# Patient Record
Sex: Female | Born: 1955 | Race: White | Hispanic: No | Marital: Married | Smoking: Former smoker
Health system: Southern US, Community
[De-identification: ages and names within clinical notes are randomized; demographics above are authoritative.]

## PROBLEM LIST (undated history)

## (undated) DIAGNOSIS — F909 Attention-deficit hyperactivity disorder, unspecified type: Secondary | ICD-10-CM

## (undated) DIAGNOSIS — Z974 Presence of external hearing-aid: Secondary | ICD-10-CM

## (undated) DIAGNOSIS — F32A Depression, unspecified: Secondary | ICD-10-CM

## (undated) DIAGNOSIS — R000 Tachycardia, unspecified: Secondary | ICD-10-CM

## (undated) DIAGNOSIS — M1990 Unspecified osteoarthritis, unspecified site: Secondary | ICD-10-CM

## (undated) DIAGNOSIS — R0600 Dyspnea, unspecified: Secondary | ICD-10-CM

## (undated) DIAGNOSIS — K219 Gastro-esophageal reflux disease without esophagitis: Secondary | ICD-10-CM

## (undated) DIAGNOSIS — I499 Cardiac arrhythmia, unspecified: Secondary | ICD-10-CM

## (undated) DIAGNOSIS — Z973 Presence of spectacles and contact lenses: Secondary | ICD-10-CM

## (undated) DIAGNOSIS — F329 Major depressive disorder, single episode, unspecified: Secondary | ICD-10-CM

## (undated) HISTORY — DX: Gastro-esophageal reflux disease without esophagitis: K21.9

## (undated) HISTORY — DX: Tachycardia, unspecified: R00.0

## (undated) HISTORY — DX: Cardiac arrhythmia, unspecified: I49.9

## (undated) HISTORY — DX: Dyspnea, unspecified: R06.00

---
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
## 1981-03-31 HISTORY — PX: ABDOMINAL HYSTERECTOMY: SHX81

## 1983-04-01 HISTORY — PX: KNEE SURGERY: SHX244

## 1996-03-31 HISTORY — PX: OTHER SURGICAL HISTORY: SHX169

## 1998-10-02 ENCOUNTER — Emergency Department (HOSPITAL_COMMUNITY): Admission: EM | Admit: 1998-10-02 | Discharge: 1998-10-02 | Payer: Self-pay | Admitting: Emergency Medicine

## 1998-11-13 ENCOUNTER — Other Ambulatory Visit: Admission: RE | Admit: 1998-11-13 | Discharge: 1998-11-13 | Payer: Self-pay | Admitting: Obstetrics and Gynecology

## 1999-11-14 ENCOUNTER — Encounter: Admission: RE | Admit: 1999-11-14 | Discharge: 1999-11-14 | Payer: Self-pay | Admitting: Gastroenterology

## 1999-11-14 ENCOUNTER — Encounter: Payer: Self-pay | Admitting: Gastroenterology

## 1999-12-12 ENCOUNTER — Other Ambulatory Visit: Admission: RE | Admit: 1999-12-12 | Discharge: 1999-12-12 | Payer: Self-pay | Admitting: Obstetrics and Gynecology

## 2000-03-31 HISTORY — PX: ANTERIOR AND POSTERIOR VAGINAL REPAIR: SUR5

## 2000-08-18 ENCOUNTER — Observation Stay (HOSPITAL_COMMUNITY): Admission: RE | Admit: 2000-08-18 | Discharge: 2000-08-19 | Payer: Self-pay | Admitting: Obstetrics and Gynecology

## 2001-01-06 ENCOUNTER — Other Ambulatory Visit: Admission: RE | Admit: 2001-01-06 | Discharge: 2001-01-06 | Payer: Self-pay | Admitting: Obstetrics and Gynecology

## 2002-03-14 ENCOUNTER — Other Ambulatory Visit: Admission: RE | Admit: 2002-03-14 | Discharge: 2002-03-14 | Payer: Self-pay | Admitting: Obstetrics and Gynecology

## 2003-04-06 ENCOUNTER — Other Ambulatory Visit: Admission: RE | Admit: 2003-04-06 | Discharge: 2003-04-06 | Payer: Self-pay | Admitting: Obstetrics and Gynecology

## 2003-12-11 ENCOUNTER — Encounter: Admission: RE | Admit: 2003-12-11 | Discharge: 2003-12-11 | Payer: Self-pay | Admitting: Internal Medicine

## 2004-05-29 ENCOUNTER — Other Ambulatory Visit: Admission: RE | Admit: 2004-05-29 | Discharge: 2004-05-29 | Payer: Self-pay | Admitting: Obstetrics and Gynecology

## 2005-06-27 ENCOUNTER — Encounter: Admission: RE | Admit: 2005-06-27 | Discharge: 2005-06-27 | Payer: Self-pay | Admitting: Internal Medicine

## 2005-09-13 ENCOUNTER — Encounter: Admission: RE | Admit: 2005-09-13 | Discharge: 2005-09-13 | Payer: Self-pay | Admitting: Neurology

## 2012-06-17 ENCOUNTER — Other Ambulatory Visit: Payer: Self-pay | Admitting: Internal Medicine

## 2012-06-17 DIAGNOSIS — R109 Unspecified abdominal pain: Secondary | ICD-10-CM

## 2012-06-18 ENCOUNTER — Ambulatory Visit
Admission: RE | Admit: 2012-06-18 | Discharge: 2012-06-18 | Disposition: A | Discharge status: Home / Self Care | Payer: BC Managed Care – PPO | Source: Ambulatory Visit | Attending: Internal Medicine | Admitting: Internal Medicine

## 2012-06-18 DIAGNOSIS — R109 Unspecified abdominal pain: Secondary | ICD-10-CM

## 2012-06-22 ENCOUNTER — Encounter (INDEPENDENT_AMBULATORY_CARE_PROVIDER_SITE_OTHER): Payer: Self-pay | Admitting: Surgery

## 2012-06-28 ENCOUNTER — Encounter (INDEPENDENT_AMBULATORY_CARE_PROVIDER_SITE_OTHER): Payer: Self-pay | Admitting: Surgery

## 2012-06-28 ENCOUNTER — Ambulatory Visit (INDEPENDENT_AMBULATORY_CARE_PROVIDER_SITE_OTHER): Payer: BC Managed Care – PPO | Admitting: Surgery

## 2012-06-28 DIAGNOSIS — K8020 Calculus of gallbladder without cholecystitis without obstruction: Secondary | ICD-10-CM | POA: Insufficient documentation

## 2012-06-28 NOTE — Progress Notes (Signed)
Patient ID: OTHA Powers, female   DOB: 07-17-1955, 57 y.o.   MRN: [id number]  Chief Complaint  Patient presents with  . Other    Eval gallbladder    HPI Kayla Powers is a 57 y.o. female.   HPI She is referred by Dr. Benjaman Kindler for evaluation of epigastric abdominal pain. She has had 2 separate attacks of epigastric abdominal pain which created shortness of breath. Her last was a week ago. She had no nausea or vomiting with this attack. She did have some pain referred over to her flank. It is difficult to remember what she ate prior to the attacks. She has chronic constipation. The pain was severe and sharp when it occurred. She is otherwise without complaints. Past Medical History  Diagnosis Date  . Tachycardia   . Arrhythmia   . Dyspnea   . GERD (gastroesophageal reflux disease)     Past Surgical History  Procedure Laterality Date  . Abdominal hysterectomy  1983  . Appendectomy  1971  . Breast implants  1998  . Knee surgery  1985  . Anterior and posterior vaginal repair  2002    History reviewed. No pertinent family history.  Social History History  Substance Use Topics  . Smoking status: Former Smoker    Quit date: 04/02/2004  . Smokeless tobacco: Not on file  . Alcohol Use: Yes    Allergies  Allergen Reactions  . Codeine   . Penicillins     Current Outpatient Prescriptions  Medication Sig Dispense Refill  . amitriptyline (ELAVIL) 75 MG tablet Take by mouth at bedtime.      Marland Kitchen amphetamine-dextroamphetamine (ADDERALL) 5 MG tablet Take 5 mg by mouth daily.      Marland Kitchen atenolol (TENORMIN) 100 MG tablet Take 100 mg by mouth daily.      Marland Kitchen buPROPion (WELLBUTRIN) 75 MG tablet Take 75 mg by mouth 2 (two) times daily.      . calcium citrate (CALCITRATE - DOSED IN MG ELEMENTAL CALCIUM) 950 MG tablet Take 1 tablet by mouth daily.      Marland Kitchen estradiol (VIVELLE-DOT) 0.025 MG/24HR Place 1 patch onto the skin 2 (two) times a week.      . Omeprazole (PRILOSEC PO) Take by  mouth.      . beta carotene w/minerals (OCUVITE) tablet Take 1 tablet by mouth daily.       No current facility-administered medications for this visit.    Review of Systems Review of Systems  Constitutional: Negative for fever, chills and unexpected weight change.  HENT: Negative for hearing loss, congestion, sore throat, trouble swallowing and voice change.   Eyes: Negative for visual disturbance.  Respiratory: Negative for cough and wheezing.   Cardiovascular: Negative for chest pain, palpitations and leg swelling.  Gastrointestinal: Positive for abdominal pain. Negative for nausea, vomiting, diarrhea, constipation, blood in stool, abdominal distention and anal bleeding.  Genitourinary: Negative for hematuria, vaginal bleeding and difficulty urinating.  Musculoskeletal: Negative for arthralgias.  Skin: Negative for rash and wound.  Neurological: Negative for seizures, syncope and headaches.  Hematological: Negative for adenopathy. Does not bruise/bleed easily.  Psychiatric/Behavioral: Negative for confusion.    There were no vitals taken for this visit.  Physical Exam Physical Exam  Constitutional: She is oriented to person, place, and time. She appears well-developed and well-nourished. No distress.  HENT:  Head: Normocephalic and atraumatic.  Right Ear: External ear normal.  Left Ear: External ear normal.  Nose: Nose normal.  Mouth/Throat: Oropharynx is clear  and moist. No oropharyngeal exudate.  Eyes: Conjunctivae are normal. Pupils are equal, round, and reactive to light. Right eye exhibits no discharge. Left eye exhibits no discharge. No scleral icterus.  Neck: Normal range of motion. Neck supple. No tracheal deviation present. No thyromegaly present.  Cardiovascular: Normal rate, regular rhythm and intact distal pulses.   Murmur heard. Pulmonary/Chest: Effort normal and breath sounds normal. No respiratory distress. She has no wheezes.  Abdominal: Soft. Bowel sounds are  normal. She exhibits no distension. There is no tenderness. There is no rebound.  Musculoskeletal: Normal range of motion. She exhibits no edema and no tenderness.  Lymphadenopathy:    She has no cervical adenopathy.  Neurological: She is alert and oriented to person, place, and time.  Skin: Skin is warm and dry. No rash noted. She is not diaphoretic. No erythema.  Psychiatric: Her behavior is normal. Judgment normal.    Data Reviewed I reviewed her ultrasound the abdomen showing either one large gallstone or a conglomeration of small gallstones. The common bile duct is normal and there is no gallbladder wall thickening  Assessment    Symptomatic cholelithiasis     Plan    Laparoscopic cholecystectomy with possible cholangiogram is recommended. I gave her literature regarding this and discussed in detail. I discussed the risks which includes but is not limited to bleeding, infection, bile duct injury, bile leak, need to convert to an open procedure, postoperative recovery, et Karie Soda. She understands and wishes to proceed. Likelihood of success is good        Dondrell Loudermilk A 06/28/2012, 10:26 AM

## 2012-07-02 ENCOUNTER — Encounter (HOSPITAL_BASED_OUTPATIENT_CLINIC_OR_DEPARTMENT_OTHER): Payer: Self-pay | Admitting: *Deleted

## 2012-07-02 NOTE — Progress Notes (Signed)
Pt has hx tachy hr-saw dr Rico Ala yearly controlled with meds Will get notes-to come in for [id number]

## 2012-07-05 ENCOUNTER — Encounter (HOSPITAL_BASED_OUTPATIENT_CLINIC_OR_DEPARTMENT_OTHER)
Admission: RE | Admit: 2012-07-05 | Discharge: 2012-07-05 | Disposition: A | Discharge status: Home / Self Care | Payer: BC Managed Care – PPO | Source: Ambulatory Visit | Attending: Surgery | Admitting: Surgery

## 2012-07-05 LAB — BASIC METABOLIC PANEL
BUN: 15 mg/dL (ref 6–23)
CO2: 30 mEq/L (ref 19–32)
GFR calc non Af Amer: >90 mL/min (ref >90)
Glucose, Bld: 69 mg/dL — ABNORMAL LOW (ref 70–99)
Potassium: 4.4 mEq/L (ref 3.5–5.1)
Sodium: 140 mEq/L (ref 135–145)

## 2012-07-07 ENCOUNTER — Telehealth: Payer: Self-pay | Admitting: *Deleted

## 2012-07-07 MED ORDER — BUPROPION HCL 75 MG PO TABS: 75.0000 mg | ORAL_TABLET | Freq: Two times a day (BID) | ORAL | Status: DC

## 2012-07-07 NOTE — Telephone Encounter (Signed)
Please inform this pt she needs to see Dr Alberteen Sam to get refill on her Wellbutrin. We only can refill 30 days. Thanks PG

## 2012-07-07 NOTE — H&P (Signed)
Patient ID: Kayla Powers, female DOB: 1956-01-05, 57 y.o. MRN: [id number]  Chief Complaint   Patient presents with   .  Other     Eval gallbladder   HPI  Kayla Powers is a 57 y.o. female.  HPI  She is referred by Dr. Benjaman Kindler for evaluation of epigastric abdominal pain. She has had 2 separate attacks of epigastric abdominal pain which created shortness of breath. Her last was a week ago. She had no nausea or vomiting with this attack. She did have some pain referred over to her flank. It is difficult to remember what she ate prior to the attacks. She has chronic constipation. The pain was severe and sharp when it occurred. She is otherwise without complaints.  Past Medical History   Diagnosis  Date   .  Tachycardia    .  Arrhythmia    .  Dyspnea    .  GERD (gastroesophageal reflux disease)     Past Surgical History   Procedure  Laterality  Date   .  Abdominal hysterectomy   1983   .  Appendectomy   1971   .  Breast implants   1998   .  Knee surgery   1985   .  Anterior and posterior vaginal repair   2002   History reviewed. No pertinent family history.  Social History  History   Substance Use Topics   .  Smoking status:  Former Smoker     Quit date:  04/02/2004   .  Smokeless tobacco:  Not on file   .  Alcohol Use:  Yes    Allergies   Allergen  Reactions   .  Codeine    .  Penicillins     Current Outpatient Prescriptions   Medication  Sig  Dispense  Refill   .  amitriptyline (ELAVIL) 75 MG tablet  Take by mouth at bedtime.     Marland Kitchen  amphetamine-dextroamphetamine (ADDERALL) 5 MG tablet  Take 5 mg by mouth daily.     Marland Kitchen  atenolol (TENORMIN) 100 MG tablet  Take 100 mg by mouth daily.     Marland Kitchen  buPROPion (WELLBUTRIN) 75 MG tablet  Take 75 mg by mouth 2 (two) times daily.     .  calcium citrate (CALCITRATE - DOSED IN MG ELEMENTAL CALCIUM) 950 MG tablet  Take 1 tablet by mouth daily.     Marland Kitchen  estradiol (VIVELLE-DOT) 0.025 MG/24HR  Place 1 patch onto the skin 2 (two) times a  week.     .  Omeprazole (PRILOSEC PO)  Take by mouth.     .  beta carotene w/minerals (OCUVITE) tablet  Take 1 tablet by mouth daily.      No current facility-administered medications for this visit.   Review of Systems  Review of Systems  Constitutional: Negative for fever, chills and unexpected weight change.  HENT: Negative for hearing loss, congestion, sore throat, trouble swallowing and voice change.  Eyes: Negative for visual disturbance.  Respiratory: Negative for cough and wheezing.  Cardiovascular: Negative for chest pain, palpitations and leg swelling.  Gastrointestinal: Positive for abdominal pain. Negative for nausea, vomiting, diarrhea, constipation, blood in stool, abdominal distention and anal bleeding.  Genitourinary: Negative for hematuria, vaginal bleeding and difficulty urinating.  Musculoskeletal: Negative for arthralgias.  Skin: Negative for rash and wound.  Neurological: Negative for seizures, syncope and headaches.  Hematological: Negative for adenopathy. Does not bruise/bleed easily.  Psychiatric/Behavioral: Negative for confusion.  There were  no vitals taken for this visit.  Physical Exam  Physical Exam  Constitutional: She is oriented to person, place, and time. She appears well-developed and well-nourished. No distress.  HENT:  Head: Normocephalic and atraumatic.  Right Ear: External ear normal.  Left Ear: External ear normal.  Nose: Nose normal.  Mouth/Throat: Oropharynx is clear and moist. No oropharyngeal exudate.  Eyes: Conjunctivae are normal. Pupils are equal, round, and reactive to light. Right eye exhibits no discharge. Left eye exhibits no discharge. No scleral icterus.  Neck: Normal range of motion. Neck supple. No tracheal deviation present. No thyromegaly present.  Cardiovascular: Normal rate, regular rhythm and intact distal pulses.  Murmur heard.  Pulmonary/Chest: Effort normal and breath sounds normal. No respiratory distress. She has no  wheezes.  Abdominal: Soft. Bowel sounds are normal. She exhibits no distension. There is no tenderness. There is no rebound.  Musculoskeletal: Normal range of motion. She exhibits no edema and no tenderness.  Lymphadenopathy:  She has no cervical adenopathy.  Neurological: She is alert and oriented to person, place, and time.  Skin: Skin is warm and dry. No rash noted. She is not diaphoretic. No erythema.  Psychiatric: Her behavior is normal. Judgment normal.  Data Reviewed  I reviewed her ultrasound the abdomen showing either one large gallstone or a conglomeration of small gallstones. The common bile duct is normal and there is no gallbladder wall thickening  Assessment  Symptomatic cholelithiasis  Plan  Laparoscopic cholecystectomy with possible cholangiogram is recommended. I gave her literature regarding this and discussed in detail. I discussed the risks which includes but is not limited to bleeding, infection, bile duct injury, bile leak, need to convert to an open procedure, postoperative recovery, et Karie Soda. She understands and wishes to proceed. Likelihood of success is good

## 2012-07-08 ENCOUNTER — Ambulatory Visit (HOSPITAL_BASED_OUTPATIENT_CLINIC_OR_DEPARTMENT_OTHER)
Admission: RE | Admit: 2012-07-08 | Discharge: 2012-07-08 | Disposition: A | Discharge status: Home / Self Care | Payer: BC Managed Care – PPO | Source: Ambulatory Visit | Attending: Surgery | Admitting: Surgery

## 2012-07-08 ENCOUNTER — Encounter (HOSPITAL_BASED_OUTPATIENT_CLINIC_OR_DEPARTMENT_OTHER): Payer: Self-pay | Admitting: *Deleted

## 2012-07-08 ENCOUNTER — Encounter (HOSPITAL_BASED_OUTPATIENT_CLINIC_OR_DEPARTMENT_OTHER)
Admission: RE | Disposition: A | Discharge status: Home / Self Care | Payer: Self-pay | Source: Ambulatory Visit | Attending: Surgery

## 2012-07-08 ENCOUNTER — Ambulatory Visit (HOSPITAL_BASED_OUTPATIENT_CLINIC_OR_DEPARTMENT_OTHER): Payer: BC Managed Care – PPO | Admitting: *Deleted

## 2012-07-08 DIAGNOSIS — K8010 Calculus of gallbladder with chronic cholecystitis without obstruction: Secondary | ICD-10-CM

## 2012-07-08 DIAGNOSIS — Z87891 Personal history of nicotine dependence: Secondary | ICD-10-CM | POA: Insufficient documentation

## 2012-07-08 DIAGNOSIS — Z885 Allergy status to narcotic agent status: Secondary | ICD-10-CM | POA: Insufficient documentation

## 2012-07-08 DIAGNOSIS — R000 Tachycardia, unspecified: Secondary | ICD-10-CM | POA: Insufficient documentation

## 2012-07-08 DIAGNOSIS — Z79899 Other long term (current) drug therapy: Secondary | ICD-10-CM | POA: Insufficient documentation

## 2012-07-08 DIAGNOSIS — Z880 Allergy status to penicillin: Secondary | ICD-10-CM | POA: Insufficient documentation

## 2012-07-08 DIAGNOSIS — K219 Gastro-esophageal reflux disease without esophagitis: Secondary | ICD-10-CM | POA: Insufficient documentation

## 2012-07-08 HISTORY — DX: Unspecified osteoarthritis, unspecified site: M19.90

## 2012-07-08 HISTORY — DX: Presence of spectacles and contact lenses: Z97.3

## 2012-07-08 HISTORY — DX: Major depressive disorder, single episode, unspecified: F32.9

## 2012-07-08 HISTORY — DX: Depression, unspecified: F32.A

## 2012-07-08 HISTORY — PX: CHOLECYSTECTOMY: SHX55

## 2012-07-08 HISTORY — DX: Presence of external hearing-aid: Z97.4

## 2012-07-08 HISTORY — DX: Attention-deficit hyperactivity disorder, unspecified type: F90.9

## 2012-07-08 SURGERY — LAPAROSCOPIC CHOLECYSTECTOMY
Anesthesia: General | Site: Abdomen | Wound class: Clean Contaminated

## 2012-07-08 MED ORDER — SODIUM CHLORIDE 0.9 % IV SOLN: 250.0000 mL | INTRAVENOUS | Status: DC | PRN

## 2012-07-08 MED ORDER — HYDROMORPHONE HCL PF 1 MG/ML IJ SOLN
0.2500 mg | INTRAMUSCULAR | Status: DC | PRN
  Administered 2012-07-08: 0.5 mg via INTRAVENOUS

## 2012-07-08 MED ORDER — MORPHINE SULFATE 4 MG/ML IJ SOLN: 4.0000 mg | INTRAMUSCULAR | Status: DC | PRN

## 2012-07-08 MED ORDER — HYDROMORPHONE HCL PF 1 MG/ML IJ SOLN
0.2500 mg | INTRAMUSCULAR | Status: DC | PRN
  Administered 2012-07-08 (×4): 0.5 mg via INTRAVENOUS

## 2012-07-08 MED ORDER — MIDAZOLAM HCL 2 MG/2ML IJ SOLN: 1.0000 mg | INTRAMUSCULAR | Status: DC | PRN

## 2012-07-08 MED ORDER — SODIUM CHLORIDE 0.9 % IJ SOLN: 3.0000 mL | INTRAMUSCULAR | Status: DC | PRN

## 2012-07-08 MED ORDER — DEXAMETHASONE SODIUM PHOSPHATE 4 MG/ML IJ SOLN
INTRAMUSCULAR | Status: DC | PRN
  Administered 2012-07-08: 4 mg via INTRAVENOUS

## 2012-07-08 MED ORDER — FENTANYL CITRATE 0.05 MG/ML IJ SOLN: 50.0000 ug | INTRAMUSCULAR | Status: DC | PRN

## 2012-07-08 MED ORDER — HYDROCODONE-ACETAMINOPHEN 5-325 MG PO TABS: 1.0000 | ORAL_TABLET | ORAL | Status: DC | PRN

## 2012-07-08 MED ORDER — ACETAMINOPHEN 325 MG PO TABS: 650.0000 mg | ORAL_TABLET | ORAL | Status: DC | PRN

## 2012-07-08 MED ORDER — ROCURONIUM BROMIDE 100 MG/10ML IV SOLN
INTRAVENOUS | Status: DC | PRN
  Administered 2012-07-08: 30 mg via INTRAVENOUS

## 2012-07-08 MED ORDER — BUPIVACAINE-EPINEPHRINE PF 0.5-1:200000 % IJ SOLN
INTRAMUSCULAR | Status: DC | PRN
  Administered 2012-07-08: 20 mL

## 2012-07-08 MED ORDER — ONDANSETRON HCL 4 MG/2ML IJ SOLN: 4.0000 mg | Freq: Once | INTRAMUSCULAR | Status: DC | PRN

## 2012-07-08 MED ORDER — GLYCOPYRROLATE 0.2 MG/ML IJ SOLN
INTRAMUSCULAR | Status: DC | PRN
  Administered 2012-07-08: .5 mg via INTRAVENOUS

## 2012-07-08 MED ORDER — NEOSTIGMINE METHYLSULFATE 1 MG/ML IJ SOLN
INTRAMUSCULAR | Status: DC | PRN
  Administered 2012-07-08: 2.5 mg via INTRAVENOUS

## 2012-07-08 MED ORDER — ACETAMINOPHEN 10 MG/ML IV SOLN: 1000.0000 mg | Freq: Once | INTRAVENOUS | Status: DC | PRN

## 2012-07-08 MED ORDER — MIDAZOLAM HCL 5 MG/5ML IJ SOLN
INTRAMUSCULAR | Status: DC | PRN
  Administered 2012-07-08: 1 mg via INTRAVENOUS

## 2012-07-08 MED ORDER — LIDOCAINE HCL 4 % MT SOLN
OROMUCOSAL | Status: DC | PRN
  Administered 2012-07-08: 4 mL via TOPICAL

## 2012-07-08 MED ORDER — DIAZEPAM 5 MG/ML IJ SOLN
2.5000 mg | INTRAMUSCULAR | Status: DC | PRN
  Administered 2012-07-08: 2.5 mg via INTRAVENOUS

## 2012-07-08 MED ORDER — ACETAMINOPHEN 650 MG RE SUPP: 650.0000 mg | RECTAL | Status: DC | PRN

## 2012-07-08 MED ORDER — PROPOFOL 10 MG/ML IV BOLUS
INTRAVENOUS | Status: DC | PRN
  Administered 2012-07-08: 150 mg via INTRAVENOUS
  Administered 2012-07-08: 50 mg via INTRAVENOUS

## 2012-07-08 MED ORDER — KETOROLAC TROMETHAMINE 30 MG/ML IJ SOLN
30.0000 mg | Freq: Once | INTRAMUSCULAR | Status: AC
  Administered 2012-07-08: 30 mg via INTRAVENOUS

## 2012-07-08 MED ORDER — OXYCODONE HCL 5 MG PO TABS: 5.0000 mg | ORAL_TABLET | ORAL | Status: DC | PRN

## 2012-07-08 MED ORDER — CIPROFLOXACIN IN D5W 400 MG/200ML IV SOLN
400.0000 mg | INTRAVENOUS | Status: AC
  Administered 2012-07-08 (×2): 400 mg via INTRAVENOUS

## 2012-07-08 MED ORDER — LACTATED RINGERS IV SOLN
INTRAVENOUS | Status: DC
  Administered 2012-07-08 (×2): via INTRAVENOUS

## 2012-07-08 MED ORDER — ONDANSETRON HCL 4 MG/2ML IJ SOLN
INTRAMUSCULAR | Status: DC | PRN
  Administered 2012-07-08: 4 mg via INTRAVENOUS

## 2012-07-08 MED ORDER — ONDANSETRON HCL 4 MG/2ML IJ SOLN: 4.0000 mg | Freq: Four times a day (QID) | INTRAMUSCULAR | Status: DC | PRN

## 2012-07-08 MED ORDER — SODIUM CHLORIDE 0.9 % IJ SOLN: 3.0000 mL | Freq: Two times a day (BID) | INTRAMUSCULAR | Status: DC

## 2012-07-08 MED ORDER — LIDOCAINE HCL (CARDIAC) 20 MG/ML IV SOLN
INTRAVENOUS | Status: DC | PRN
  Administered 2012-07-08: 40 mg via INTRAVENOUS

## 2012-07-08 MED ORDER — FENTANYL CITRATE 0.05 MG/ML IJ SOLN
INTRAMUSCULAR | Status: DC | PRN
  Administered 2012-07-08 (×3): 50 ug via INTRAVENOUS

## 2012-07-08 SURGICAL SUPPLY — 43 items
APL SKNCLS STERI-STRIP NONHPOA (GAUZE/BANDAGES/DRESSINGS) ×1
APPLIER CLIP 5 13 M/L LIGAMAX5 (MISCELLANEOUS) ×2
APR CLP MED LRG 5 ANG JAW (MISCELLANEOUS) ×1
BAG SPEC RTRVL LRG 6X4 10 (ENDOMECHANICALS) ×1
BANDAGE ADHESIVE 1X3 (GAUZE/BANDAGES/DRESSINGS) ×8 IMPLANT
BENZOIN TINCTURE PRP APPL 2/3 (GAUZE/BANDAGES/DRESSINGS) ×2 IMPLANT
BLADE SURG ROTATE 9660 (MISCELLANEOUS) IMPLANT
CANISTER SUCTION 2500CC (MISCELLANEOUS) ×2 IMPLANT
CHLORAPREP W/TINT 26ML (MISCELLANEOUS) ×2 IMPLANT
CLIP APPLIE 5 13 M/L LIGAMAX5 (MISCELLANEOUS) ×1 IMPLANT
CLOTH BEACON ORANGE TIMEOUT ST (SAFETY) ×2 IMPLANT
COVER MAYO STAND STRL (DRAPES) IMPLANT
DECANTER SPIKE VIAL GLASS SM (MISCELLANEOUS) ×2 IMPLANT
DRAPE C-ARM 42X72 X-RAY (DRAPES) IMPLANT
DRAPE UTILITY XL STRL (DRAPES) ×2 IMPLANT
ELECT REM PT RETURN 9FT ADLT (ELECTROSURGICAL) ×2
ELECTRODE REM PT RTRN 9FT ADLT (ELECTROSURGICAL) ×1 IMPLANT
FILTER SMOKE EVAC LAPAROSHD (FILTER) ×1 IMPLANT
GLOVE BIOGEL PI IND STRL 6.5 (GLOVE) IMPLANT
GLOVE BIOGEL PI IND STRL 7.0 (GLOVE) IMPLANT
GLOVE BIOGEL PI INDICATOR 6.5 (GLOVE) ×1
GLOVE BIOGEL PI INDICATOR 7.0 (GLOVE) ×1
GLOVE ECLIPSE 6.5 STRL STRAW (GLOVE) ×1 IMPLANT
GLOVE EXAM NITRILE PF MED BLUE (GLOVE) ×1 IMPLANT
GLOVE SURG SIGNA 7.5 PF LTX (GLOVE) ×2 IMPLANT
GOWN PREVENTION PLUS XLARGE (GOWN DISPOSABLE) ×2 IMPLANT
GOWN STRL NON-REIN LRG LVL3 (GOWN DISPOSABLE) ×6 IMPLANT
NS IRRIG 1000ML POUR BTL (IV SOLUTION) ×2 IMPLANT
PACK BASIN DAY SURGERY FS (CUSTOM PROCEDURE TRAY) ×2 IMPLANT
POUCH SPECIMEN RETRIEVAL 10MM (ENDOMECHANICALS) ×1 IMPLANT
SCISSORS LAP 5X35 DISP (ENDOMECHANICALS) IMPLANT
SET CHOLANGIOGRAPH 5 50 .035 (SET/KITS/TRAYS/PACK) IMPLANT
SET IRRIG TUBING LAPAROSCOPIC (IRRIGATION / IRRIGATOR) ×2 IMPLANT
SLEEVE ENDOPATH XCEL 5M (ENDOMECHANICALS) ×4 IMPLANT
SLEEVE SCD COMPRESS KNEE MED (MISCELLANEOUS) ×2 IMPLANT
SPECIMEN JAR SMALL (MISCELLANEOUS) ×2 IMPLANT
SUT MON AB 4-0 PC3 18 (SUTURE) ×2 IMPLANT
TOWEL OR 17X24 6PK STRL BLUE (TOWEL DISPOSABLE) ×2 IMPLANT
TOWEL OR NON WOVEN STRL DISP B (DISPOSABLE) ×2 IMPLANT
TRAY LAPAROSCOPIC (CUSTOM PROCEDURE TRAY) ×2 IMPLANT
TROCAR XCEL BLUNT TIP 100MML (ENDOMECHANICALS) ×2 IMPLANT
TROCAR XCEL NON-BLD 5MMX100MML (ENDOMECHANICALS) ×2 IMPLANT
TUBE CONNECTING 20X1/4 (TUBING) ×2 IMPLANT

## 2012-07-08 NOTE — Anesthesia Procedure Notes (Signed)
Procedure Name: Intubation Date/Time: 07/08/2012 10:05 AM Performed by: Meyer Russel Pre-anesthesia Checklist: Patient identified, Emergency Drugs available, Suction available and Patient being monitored Patient Re-evaluated:Patient Re-evaluated prior to inductionOxygen Delivery Method: Circle System Utilized Preoxygenation: Pre-oxygenation with 100% oxygen Intubation Type: IV induction Ventilation: Mask ventilation without difficulty Laryngoscope Size: Miller and 2 Grade View: Grade II Tube type: Oral Tube size: 7.0 mm Number of attempts: 2 (laryngoscopy x 1, unable to visulaizw cords.  laryngoscopy by Dr. Noreene Larsson with glide scope  ETT x i attempt.  +EtCO2 BSBE) Airway Equipment and Method: stylet,  oral airway,  LTA kit utilized,  Video-laryngoscopy and Rigid stylet Placement Confirmation: ETT inserted through vocal cords under direct vision,  positive ETCO2 and breath sounds checked- equal and bilateral Secured at: 22 cm Tube secured with: Tape Dental Injury: Teeth and Oropharynx as per pre-operative assessment

## 2012-07-08 NOTE — Transfer of Care (Signed)
Immediate Anesthesia Transfer of Care Note  Patient: Kayla Powers  Procedure(s) Performed: Procedure(s): LAPAROSCOPIC CHOLECYSTECTOMY possible IOC  (N/A)  Patient Location: PACU  Anesthesia Type:General  Level of Consciousness: awake, alert  and oriented  Airway & Oxygen Therapy: Patient Spontanous Breathing and Patient connected to face mask oxygen  Post-op Assessment: Report given to PACU RN, Post -op Vital signs reviewed and stable and Patient moving all extremities  Post vital signs: Reviewed and stable  Complications: No apparent anesthesia complications

## 2012-07-08 NOTE — Anesthesia Postprocedure Evaluation (Signed)
  Anesthesia Post-op Note  Patient: Kayla Powers  Procedure(s) Performed: Procedure(s): LAPAROSCOPIC CHOLECYSTECTOMY possible IOC  (N/A)  Patient Location: PACU  Anesthesia Type:General  Level of Consciousness: awake and alert   Airway and Oxygen Therapy: Patient Spontanous Breathing  Post-op Pain: mild  Post-op Assessment: Post-op Vital signs reviewed, Patient's Cardiovascular Status Stable, Respiratory Function Stable, Patent Airway and Pain level controlled  Post-op Vital Signs: stable  Complications: No apparent anesthesia complications

## 2012-07-08 NOTE — Anesthesia Postprocedure Evaluation (Signed)
  Anesthesia Post-op Note  Patient: Kayla Powers  Procedure(s) Performed: Procedure(s): LAPAROSCOPIC CHOLECYSTECTOMY possible IOC  (N/A)  Patient Location: PACU  Anesthesia Type:General  Level of Consciousness: awake, alert  and oriented  Airway and Oxygen Therapy: Patient Spontanous Breathing and Patient connected to nasal cannula oxygen  Post-op Pain: mild  Post-op Assessment: Post-op Vital signs reviewed, Patient's Cardiovascular Status Stable, Respiratory Function Stable, Patent Airway and Pain level controlled  Post-op Vital Signs: stable  Complications: No apparent anesthesia complications 

## 2012-07-08 NOTE — Anesthesia Preprocedure Evaluation (Signed)
Anesthesia Evaluation  Patient identified by MRN, date of birth, ID band Patient awake    Reviewed: Allergy & Precautions, H&P , NPO status , Patient's Chart, lab work & pertinent test results  Airway Mallampati: II      Dental  (+) Teeth Intact and Dental Advisory Given   Pulmonary  breath sounds clear to auscultation        Cardiovascular Rhythm:Regular Rate:Normal     Neuro/Psych    GI/Hepatic   Endo/Other    Renal/GU      Musculoskeletal   Abdominal   Peds  Hematology   Anesthesia Other Findings   Reproductive/Obstetrics                           Anesthesia Physical Anesthesia Plan  ASA: II  Anesthesia Plan: General   Post-op Pain Management:    Induction: Intravenous  Airway Management Planned: Oral ETT  Additional Equipment:   Intra-op Plan:   Post-operative Plan: Extubation in OR  Informed Consent: I have reviewed the patients History and Physical, chart, labs and discussed the procedure including the risks, benefits and alternatives for the proposed anesthesia with the patient or authorized representative who has indicated his/her understanding and acceptance.   Dental advisory given  Plan Discussed with: CRNA, Surgeon and Anesthesiologist  Anesthesia Plan Comments:         Anesthesia Quick Evaluation

## 2012-07-08 NOTE — Op Note (Signed)
Laparoscopic Cholecystectomy Procedure Note  Indications: This patient presents with symptomatic gallbladder disease and will undergo laparoscopic cholecystectomy.  Pre-operative Diagnosis: Calculus of gallbladder without mention of cholecystitis or obstruction  Post-operative Diagnosis: Same  Surgeon: Abigail Miyamoto A   Assistants: 0  Anesthesia: General endotracheal anesthesia  ASA Class: 1  Procedure Details  The patient was seen again in the Holding Room. The risks, benefits, complications, treatment options, and expected outcomes were discussed with the patient. The possibilities of reaction to medication, pulmonary aspiration, perforation of viscus, bleeding, recurrent infection, finding a normal gallbladder, the need for additional procedures, failure to diagnose a condition, the possible need to convert to an open procedure, and creating a complication requiring transfusion or operation were discussed with the patient. The likelihood of improving the patient's symptoms with return to their baseline status is good.  The patient and/or family concurred with the proposed plan, giving informed consent. The site of surgery properly noted. The patient was taken to Operating Room, identified as Laurell Josephs and the procedure verified as Laparoscopic Cholecystectomy with Intraoperative Cholangiogram. A Time Out was held and the above information confirmed.  Prior to the induction of general anesthesia, antibiotic prophylaxis was administered. General endotracheal anesthesia was then administered and tolerated well. After the induction, the abdomen was prepped with Chloraprep and draped in sterile fashion. The patient was positioned in the supine position.  Local anesthetic agent was injected into the skin near the umbilicus and an incision made. We dissected down to the abdominal fascia with blunt dissection.  The fascia was incised vertically and we entered the peritoneal cavity  bluntly.  A pursestring suture of 0-Vicryl was placed around the fascial opening.  The Hasson cannula was inserted and secured with the stay suture.  Pneumoperitoneum was then created with CO2 and tolerated well without any adverse changes in the patient's vital signs. An 11-mm port was placed in the subxiphoid position.  Two 5-mm ports were placed in the right upper quadrant. All skin incisions were infiltrated with a local anesthetic agent before making the incision and placing the trocars.   We positioned the patient in reverse Trendelenburg, tilted slightly to the patient's left.  The gallbladder was identified, the fundus grasped and retracted cephalad. Adhesions were lysed bluntly and with the electrocautery where indicated, taking care not to injure any adjacent organs or viscus. The infundibulum was grasped and retracted laterally, exposing the peritoneum overlying the triangle of Calot. This was then divided and exposed in a blunt fashion. The cystic duct was clearly identified and bluntly dissected circumferentially. A critical view of the cystic duct and cystic artery was obtained.  The cystic duct was then ligated with clips and divided. The cystic artery was, dissected free, ligated with clips and divided as well.   The gallbladder was dissected from the liver bed in retrograde fashion with the electrocautery. The gallbladder was removed and placed in an Endocatch sac. The liver bed was irrigated and inspected. Hemostasis was achieved with the electrocautery. Copious irrigation was utilized and was repeatedly aspirated until clear.  The gallbladder and Endocatch sac were then removed through the umbilical port site.  The pursestring suture was used to close the umbilical fascia.    We again inspected the right upper quadrant for hemostasis.  Pneumoperitoneum was released as we removed the trocars.  4-0 Monocryl was used to close the skin.   Benzoin, steri-strips, and clean dressings were applied.  The patient was then extubated and brought to the recovery room  in stable condition. Instrument, sponge, and needle counts were correct at closure and at the conclusion of the case.   Findings: Chronic Cholecystitis with Cholelithiasis  Estimated Blood Loss: Minimal         Drains: 0         Specimens: Gallbladder           Complications: None; patient tolerated the procedure well.         Disposition: PACU - hemodynamically stable.         Condition: stable

## 2012-07-08 NOTE — Anesthesia Postprocedure Evaluation (Signed)
  Anesthesia Post-op Note  Patient: Kayla Powers  Procedure(s) Performed: Procedure(s): LAPAROSCOPIC CHOLECYSTECTOMY possible IOC  (N/A)  Patient Location: PACU  Anesthesia Type:General  Level of Consciousness: awake, alert  and oriented  Airway and Oxygen Therapy: Patient Spontanous Breathing and Patient connected to nasal cannula oxygen  Post-op Pain: mild  Post-op Assessment: Post-op Vital signs reviewed, Patient's Cardiovascular Status Stable, Respiratory Function Stable, Patent Airway and Pain level controlled  Post-op Vital Signs: stable  Complications: No apparent anesthesia complications

## 2012-07-08 NOTE — Interval H&P Note (Signed)
History and Physical Interval Note: no change in H and P  07/08/2012 9:23 AM  Kayla Powers  has presented today for surgery, with the diagnosis of symtomatic cholelithiasis   The various methods of treatment have been discussed with the patient and family. After consideration of risks, benefits and other options for treatment, the patient has consented to  Procedure(s): LAPAROSCOPIC CHOLECYSTECTOMY possible IOC  (N/A) as a surgical intervention .  The patient's history has been reviewed, patient examined, no change in status, stable for surgery.  I have reviewed the patient's chart and labs.  Questions were answered to the patient's satisfaction.     Kayla Powers A

## 2012-07-09 ENCOUNTER — Encounter (HOSPITAL_BASED_OUTPATIENT_CLINIC_OR_DEPARTMENT_OTHER): Payer: Self-pay | Admitting: Surgery

## 2012-07-10 NOTE — Anesthesia Postprocedure Evaluation (Signed)
  Anesthesia Post-op Note  Patient: Kayla Powers  Procedure(s) Performed: Procedure(s): LAPAROSCOPIC CHOLECYSTECTOMY possible IOC  (N/A)  Patient Location: PACU  Anesthesia Type:General  Level of Consciousness: awake, alert  and oriented  Airway and Oxygen Therapy: Patient Spontanous Breathing  Post-op Pain: mild  Post-op Assessment: Post-op Vital signs reviewed, Patient's Cardiovascular Status Stable, Respiratory Function Stable, Patent Airway and Pain level controlled  Post-op Vital Signs: stable  Complications: No apparent anesthesia complications

## 2012-07-23 ENCOUNTER — Ambulatory Visit (INDEPENDENT_AMBULATORY_CARE_PROVIDER_SITE_OTHER): Payer: BC Managed Care – PPO | Admitting: Surgery

## 2012-07-23 ENCOUNTER — Encounter (INDEPENDENT_AMBULATORY_CARE_PROVIDER_SITE_OTHER): Payer: Self-pay | Admitting: Surgery

## 2012-07-23 VITALS — BP 138/64 | HR 80 | Temp 97.2°F | Resp 16 | Ht 65.0 in | Wt 169.0 lb

## 2012-07-23 DIAGNOSIS — Z09 Encounter for follow-up examination after completed treatment for conditions other than malignant neoplasm: Secondary | ICD-10-CM

## 2012-07-23 NOTE — Progress Notes (Signed)
Subjective:     Patient ID: Kayla Powers, female   DOB: 11-04-1955, 57 y.o.   MRN: [id number]  HPI She is here for first postop visit. She is doing well has no complaints  Review of Systems     Objective:   Physical Exam On exam, her incisions are well-healed. The final pathology showed chronic cholecystitis with gallstones    Assessment:     Patient doing well postop     Plan:     She may resume normal activity. I will see her back as needed

## 2012-08-19 ENCOUNTER — Ambulatory Visit (INDEPENDENT_AMBULATORY_CARE_PROVIDER_SITE_OTHER): Payer: BC Managed Care – PPO | Admitting: Family Medicine

## 2012-08-19 VITALS — BP 113/78 | HR 88 | Wt 170.0 lb

## 2012-08-19 DIAGNOSIS — R41840 Attention and concentration deficit: Secondary | ICD-10-CM

## 2012-08-19 DIAGNOSIS — N951 Menopausal and female climacteric states: Secondary | ICD-10-CM

## 2012-08-19 DIAGNOSIS — R000 Tachycardia, unspecified: Secondary | ICD-10-CM

## 2012-08-19 DIAGNOSIS — G510 Bell's palsy: Secondary | ICD-10-CM

## 2012-08-19 DIAGNOSIS — K219 Gastro-esophageal reflux disease without esophagitis: Secondary | ICD-10-CM

## 2012-08-19 DIAGNOSIS — G519 Disorder of facial nerve, unspecified: Secondary | ICD-10-CM

## 2012-08-19 MED ORDER — ESTRADIOL 0.1 MG/24HR TD PTTW: 1.0000 | MEDICATED_PATCH | TRANSDERMAL | Status: DC

## 2012-08-19 MED ORDER — PHOSPHATIDYLSERINE-DHA-EPA 75-21.5-8.5 MG PO CAPS: 2.0000 | ORAL_CAPSULE | Freq: Every day | ORAL | Status: DC

## 2012-08-19 MED ORDER — AMITRIPTYLINE HCL 75 MG PO TABS: ORAL_TABLET | ORAL | Status: DC

## 2012-08-19 MED ORDER — ATENOLOL 50 MG PO TABS: ORAL_TABLET | ORAL | Status: DC

## 2012-08-19 MED ORDER — PANTOPRAZOLE SODIUM 40 MG PO TBEC: 40.0000 mg | DELAYED_RELEASE_TABLET | Freq: Every day | ORAL | Status: DC

## 2012-08-19 NOTE — Progress Notes (Signed)
  Subjective:    Patient ID: Kayla Powers, female    DOB: 03/02/1956, 57 y.o.   MRN: [id number]  HPI  Kayla Powers is here today to discuss a few issues:      1)  Mood:  She has been trying to wean herself off her Wellbutrin.   2)  ADD:  She wants to take her Nicoletta Dress more often and wean herself off her Adderall.   3)  Hypertension:  She has done well on her Atenolol and needs this medication refilled.   4)  Postmenopause:  She needs to have her estrogen patch refilled.    Review of Systems  Constitutional: Negative for activity change, fatigue and unexpected weight change.  HENT: Negative.   Eyes: Positive for pain.  Respiratory: Negative for shortness of breath.   Cardiovascular: Negative for chest pain, palpitations and leg swelling.  Gastrointestinal: Negative for diarrhea and constipation.  Endocrine: Negative.   Genitourinary: Negative for difficulty urinating.  Musculoskeletal: Negative.   Skin: Negative.   Neurological: Negative.   Hematological: Negative for adenopathy. Does not bruise/bleed easily.  Psychiatric/Behavioral: Negative for sleep disturbance and dysphoric mood. The patient is not nervous/anxious.    Past Medical History  Diagnosis Date  . Tachycardia   . GERD (gastroesophageal reflux disease)   . Arrhythmia     tachycardia  . Wears glasses   . Wears hearing aid     left  . Dyspnea   . ADHD (attention deficit hyperactivity disorder)   . Depression   . Arthritis    Family History  Problem Relation Age of Onset  . 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  She quit smoking in 2006.     Alcohol Use:  Occasional   Drug Use:  None   Diet:  Regular   Exercise:   Treadmill 4 x week    Hobbies: Grandchildren/ Reading/ Bible Study/Camping               Objective:   Physical Exam  Constitutional: She appears well-nourished. No distress.  HENT:  Head: Normocephalic.  Eyes: No scleral icterus.  Neck: No thyromegaly present.  Cardiovascular: Normal rate, regular rhythm and normal heart sounds.   Pulmonary/Chest: Effort normal and breath sounds normal.  Abdominal: There is no tenderness.  Musculoskeletal: She exhibits no edema and no tenderness.  Neurological: She is alert.  Skin: Skin is warm and dry.  Psychiatric: She has a normal mood and affect. Her behavior is normal. Judgment and thought content normal.      Assessment & Plan:   TIME 30 MINUTES:  MORE THAN 50 % OF TIME WAS INVOLVED IN COUNSELING.

## 2012-08-19 NOTE — Patient Instructions (Addendum)
Attention Deficit Hyperactivity Disorder Attention deficit hyperactivity disorder (ADHD) is a problem with behavior issues based on the way the brain functions (neurobehavioral disorder). It is a common reason for behavior and academic problems in school. CAUSES  The cause of ADHD is unknown in most cases. It may run in families. It sometimes can be associated with learning disabilities and other behavioral problems. SYMPTOMS  There are 3 types of ADHD. The 3 types and some of the symptoms include:  Inattentive  Gets bored or distracted easily.  Loses or forgets things. Forgets to hand in homework.  Has trouble organizing or completing tasks.  Difficulty staying on task.  An inability to organize daily tasks and school work.  Leaving projects, chores, or homework unfinished.  Trouble paying attention or responding to details. Careless mistakes.  Difficulty following directions. Often seems like is not listening.  Dislikes activities that require sustained attention (like chores or homework).  Hyperactive-impulsive  Feels like it is impossible to sit still or stay in a seat. Fidgeting with hands and feet.  Trouble waiting turn.  Talking too much or out of turn. Interruptive.  Speaks or acts impulsively.  Aggressive, disruptive behavior.  Constantly busy or on the go, noisy.  Combined  Has symptoms of both of the above. Often children with ADHD feel discouraged about themselves and with school. They often perform well below their abilities in school. These symptoms can cause problems in home, school, and in relationships with peers. As children get older, the excess motor activities can calm down, but the problems with paying attention and staying organized persist. Most children do not outgrow ADHD but with good treatment can learn to cope with the symptoms. DIAGNOSIS  When ADHD is suspected, the diagnosis should be made by professionals trained in ADHD.  Diagnosis will  include:  Ruling out other reasons for the child's behavior.  The caregivers will check with the child's school and check their medical records.  They will talk to teachers and parents.  Behavior rating scales for the child will be filled out by those dealing with the child on a daily basis. A diagnosis is made only after all information has been considered. TREATMENT  Treatment usually includes behavioral treatment often along with medicines. It may include stimulant medicines. The stimulant medicines decrease impulsivity and hyperactivity and increase attention. Other medicines used include antidepressants and certain blood pressure medicines. Most experts agree that treatment for ADHD should address all aspects of the child's functioning. Treatment should not be limited to the use of medicines alone. Treatment should include structured classroom management. The parents must receive education to address rewarding good behavior, discipline, and limit-setting. Tutoring or behavioral therapy or both should be available for the child. If untreated, the disorder can have long-term serious effects into adolescence and adulthood. HOME CARE INSTRUCTIONS   Often with ADHD there is a lot of frustration among the family in dealing with the illness. There is often blame and anger that is not warranted. This is a life long illness. There is no way to prevent ADHD. In many cases, because the problem affects the family as a whole, the entire family may need help. A therapist can help the family find better ways to handle the disruptive behaviors and promote change. If the child is young, most of the therapist's work is with the parents. Parents will learn techniques for coping with and improving their child's behavior. Sometimes only the child with the ADHD needs counseling. Your caregivers can help   you make these decisions.  Children with ADHD may need help in organizing. Some helpful tips include:  Keep  routines the same every day from wake-up time to bedtime. Schedule everything. This includes homework and playtime. This should include outdoor and indoor recreation. Keep the schedule on the refrigerator or a bulletin board where it is frequently seen. Mark schedule changes as far in advance as possible.  Have a place for everything and keep everything in its place. This includes clothing, backpacks, and school supplies.  Encourage writing down assignments and bringing home needed books.  Offer your child a well-balanced diet. Breakfast is especially important for school performance. Children should avoid drinks with caffeine including:  Soft drinks.  Coffee.  Tea.  However, some older children (adolescents) may find these drinks helpful in improving their attention.  Children with ADHD need consistent rules that they can understand and follow. If rules are followed, give small rewards. Children with ADHD often receive, and expect, criticism. Look for good behavior and praise it. Set realistic goals. Give clear instructions. Look for activities that can foster success and self-esteem. Make time for pleasant activities with your child. Give lots of affection.  Parents are their children's greatest advocates. Learn as much as possible about ADHD. This helps you become a stronger and better advocate for your child. It also helps you educate your child's teachers and instructors if they feel inadequate in these areas. Parent support groups are often helpful. A national group with local chapters is called CHADD (Children and Adults with Attention Deficit Hyperactivity Disorder). PROGNOSIS  There is no cure for ADHD. Children with the disorder seldom outgrow it. Many find adaptive ways to accommodate the ADHD as they mature. SEEK MEDICAL CARE IF:  Your child has repeated muscle twitches, cough or speech outbursts.  Your child has sleep problems.  Your child has a marked loss of  appetite.  Your child develops depression.  Your child has new or worsening behavioral problems.  Your child develops dizziness.  Your child has a racing heart.  Your child has stomach pains.  Your child develops headaches. Document Released: 03/07/2002 Document Revised: 06/09/2011 Document Reviewed: 10/18/2007 [employer] Patient Information 2014 [employer].  

## 2012-09-04 ENCOUNTER — Encounter: Payer: Self-pay | Admitting: Family Medicine

## 2012-09-04 DIAGNOSIS — R000 Tachycardia, unspecified: Secondary | ICD-10-CM | POA: Insufficient documentation

## 2012-09-04 DIAGNOSIS — R41840 Attention and concentration deficit: Secondary | ICD-10-CM | POA: Insufficient documentation

## 2012-09-04 DIAGNOSIS — G519 Disorder of facial nerve, unspecified: Secondary | ICD-10-CM | POA: Insufficient documentation

## 2012-09-04 DIAGNOSIS — K219 Gastro-esophageal reflux disease without esophagitis: Secondary | ICD-10-CM | POA: Insufficient documentation

## 2012-09-04 DIAGNOSIS — N951 Menopausal and female climacteric states: Secondary | ICD-10-CM | POA: Insufficient documentation

## 2012-09-04 NOTE — Assessment & Plan Note (Signed)
She was given a prescription for Minivelle.

## 2012-09-04 NOTE — Assessment & Plan Note (Signed)
Refilled her amitriptyline  

## 2012-09-04 NOTE — Assessment & Plan Note (Signed)
She would like to not have to take Adderall and is going to remain on Vayarin.

## 2012-09-04 NOTE — Assessment & Plan Note (Signed)
She was given a refill for atenolol to help control the tachycardia caused by both the amitriptyline and Adderall that she takes.

## 2012-09-04 NOTE — Assessment & Plan Note (Signed)
Refilled her Protonix.   

## 2012-11-26 ENCOUNTER — Ambulatory Visit: Payer: BC Managed Care – PPO | Admitting: Family Medicine

## 2012-12-16 ENCOUNTER — Other Ambulatory Visit: Payer: Self-pay | Admitting: Nurse Practitioner

## 2012-12-16 ENCOUNTER — Other Ambulatory Visit (HOSPITAL_COMMUNITY): Payer: Self-pay | Admitting: Nurse Practitioner

## 2012-12-16 DIAGNOSIS — R109 Unspecified abdominal pain: Secondary | ICD-10-CM

## 2012-12-16 DIAGNOSIS — R1013 Epigastric pain: Secondary | ICD-10-CM

## 2012-12-17 ENCOUNTER — Other Ambulatory Visit: Payer: BC Managed Care – PPO

## 2012-12-21 ENCOUNTER — Encounter: Payer: Self-pay | Admitting: Family Medicine

## 2012-12-21 ENCOUNTER — Ambulatory Visit (INDEPENDENT_AMBULATORY_CARE_PROVIDER_SITE_OTHER): Payer: BC Managed Care – PPO | Admitting: Family Medicine

## 2012-12-21 VITALS — BP 108/75 | HR 77 | Resp 16 | Ht 65.0 in | Wt 176.0 lb

## 2012-12-21 DIAGNOSIS — K5900 Constipation, unspecified: Secondary | ICD-10-CM

## 2012-12-21 DIAGNOSIS — I10 Essential (primary) hypertension: Secondary | ICD-10-CM

## 2012-12-21 DIAGNOSIS — E663 Overweight: Secondary | ICD-10-CM

## 2012-12-21 NOTE — Patient Instructions (Addendum)
1)  BP/Palpitations - Take 1/2 of the atenolol when you start the HCG  2)  Constipaton - Try the Linzess at night to see how you do.     Constipation, Adult Constipation is when a person has fewer than 3 bowel movements a week; has difficulty having a bowel movement; or has stools that are dry, hard, or larger than normal. As people grow older, constipation is more common. If you try to fix constipation with medicines that make you have a bowel movement (laxatives), the problem may get worse. Long-term laxative use may cause the muscles of the colon to become weak. A low-fiber diet, not taking in enough fluids, and taking certain medicines may make constipation worse. CAUSES   Certain medicines, such as antidepressants, pain medicine, iron supplements, antacids, and water pills.   Certain diseases, such as diabetes, irritable bowel syndrome (IBS), thyroid disease, or depression.   Not drinking enough water.   Not eating enough fiber-rich foods.   Stress or travel.  Lack of physical activity or exercise.  Not going to the restroom when there is the urge to have a bowel movement.  Ignoring the urge to have a bowel movement.  Using laxatives too much. SYMPTOMS   Having fewer than 3 bowel movements a week.   Straining to have a bowel movement.   Having hard, dry, or larger than normal stools.   Feeling full or bloated.   Pain in the lower abdomen.  Not feeling relief after having a bowel movement. DIAGNOSIS  Your caregiver will take a medical history and perform a physical exam. Further testing may be done for severe constipation. Some tests may include:   A barium enema X-ray to examine your rectum, colon, and sometimes, your small intestine.  A sigmoidoscopy to examine your lower colon.  A colonoscopy to examine your entire colon. TREATMENT  Treatment will depend on the severity of your constipation and what is causing it. Some dietary treatments include drinking  more fluids and eating more fiber-rich foods. Lifestyle treatments may include regular exercise. If these diet and lifestyle recommendations do not help, your caregiver may recommend taking over-the-counter laxative medicines to help you have bowel movements. Prescription medicines may be prescribed if over-the-counter medicines do not work.  HOME CARE INSTRUCTIONS   Increase dietary fiber in your diet, such as fruits, vegetables, whole grains, and beans. Limit high-fat and processed sugars in your diet, such as [place] fries, hamburgers, cookies, candies, and soda.   A fiber supplement may be added to your diet if you cannot get enough fiber from foods.   Drink enough fluids to keep your urine clear or pale yellow.   Exercise regularly or as directed by your caregiver.   Go to the restroom when you have the urge to go. Do not hold it.  Only take medicines as directed by your caregiver. Do not take other medicines for constipation without talking to your caregiver first. SEEK IMMEDIATE MEDICAL CARE IF:   You have bright red blood in your stool.   Your constipation lasts for more than 4 days or gets worse.   You have abdominal or rectal pain.   You have thin, pencil-like stools.  You have unexplained weight loss. MAKE SURE YOU:   Understand these instructions.  Will watch your condition.  Will get help right away if you are not doing well or get worse. Document Released: 12/14/2003 Document Revised: 06/09/2011 Document Reviewed: 02/18/2011 [facility] Patient Information 2014 [place], [place].

## 2012-12-21 NOTE — Progress Notes (Signed)
  Subjective:    Patient ID: Kayla Powers, female    DOB: July 22, 1955, 57 y.o.   MRN: [id number]  HPI  Kayla Powers is here today to discuss a couple of issues.  She has regained some weight and she would like to do another round of the HCG diet.  She also needs to have her atenolol refilled and she would like to discuss her constipation.        Review of Systems  Constitutional: Positive for unexpected weight change.  HENT: Negative.   Eyes: Negative.   Respiratory: Negative.   Cardiovascular: Negative.   Gastrointestinal: Positive for constipation.  Endocrine: Negative.   Genitourinary: Negative.   Musculoskeletal: Negative.   Skin: Negative.   Allergic/Immunologic: Negative.   Neurological: Negative.   Hematological: Negative.   Psychiatric/Behavioral: Negative.      Past Medical History  Diagnosis Date  . Tachycardia   . GERD (gastroesophageal reflux disease)   . Arrhythmia     tachycardia  . Wears glasses   . Wears hearing aid     left  . Dyspnea   . ADHD (attention deficit hyperactivity disorder)   . Depression   . Arthritis      Family History  Problem Relation Age of Onset  . 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  She quit smoking in 2006.     Alcohol Use:  Occasional   Drug Use:  None   Diet:  Regular   Exercise:  Treadmill 4 x week    Hobbies: Grandchildren/ Reading/ Bible Study/Camping                Objective:   Physical Exam  Vitals reviewed. Constitutional: She is oriented to person, place, and time. She appears well-developed and well-nourished.  Cardiovascular: Normal rate and regular rhythm.   Pulmonary/Chest: Effort normal and breath  sounds normal.  Neurological: She is alert and oriented to person, place, and time.  Skin: Skin is warm and dry.  Psychiatric: She has a normal mood and affect.      Assessment & Plan:   Xochil was seen today for medication management.  Diagnoses and associated orders for this visit:  Essential hypertension, benign Comments: She was given a refill for her atenolol.  She is to take 1/2 tab while she is on the HCG diet.    Unspecified constipation Comments: She was given samples of Linzess to try for constipation.    Overweight Comments: We had a long discussion about her weight.  She is tired of losing and then regaining her weight.  She will begin another round of HCG.  We'll come up with a plan in a month to help her not regain the weight.

## 2012-12-24 ENCOUNTER — Encounter (HOSPITAL_COMMUNITY)
Admission: RE | Admit: 2012-12-24 | Discharge: 2012-12-24 | Disposition: A | Discharge status: Home / Self Care | Payer: BC Managed Care – PPO | Source: Ambulatory Visit | Attending: Nurse Practitioner | Admitting: Nurse Practitioner

## 2012-12-24 DIAGNOSIS — R1013 Epigastric pain: Secondary | ICD-10-CM

## 2012-12-24 MED ORDER — TECHNETIUM TC 99M MEBROFENIN IV KIT
5.0000 | PACK | Freq: Once | INTRAVENOUS | Status: AC | PRN
  Administered 2012-12-24: 5 via INTRAVENOUS

## 2012-12-27 ENCOUNTER — Other Ambulatory Visit: Payer: Self-pay | Admitting: Gastroenterology

## 2012-12-27 ENCOUNTER — Ambulatory Visit
Admission: RE | Admit: 2012-12-27 | Discharge: 2012-12-27 | Disposition: A | Discharge status: Home / Self Care | Payer: BC Managed Care – PPO | Source: Ambulatory Visit | Attending: Gastroenterology | Admitting: Gastroenterology

## 2012-12-27 DIAGNOSIS — R52 Pain, unspecified: Secondary | ICD-10-CM

## 2012-12-27 MED ORDER — GADOBENATE DIMEGLUMINE 529 MG/ML IV SOLN
16.0000 mL | Freq: Once | INTRAVENOUS | Status: AC | PRN
  Administered 2012-12-27: 16 mL via INTRAVENOUS

## 2012-12-28 ENCOUNTER — Encounter (INDEPENDENT_AMBULATORY_CARE_PROVIDER_SITE_OTHER): Payer: Self-pay

## 2012-12-29 ENCOUNTER — Encounter (HOSPITAL_COMMUNITY): Payer: BC Managed Care – PPO

## 2013-02-02 ENCOUNTER — Encounter: Payer: Self-pay | Admitting: Family Medicine

## 2013-02-02 ENCOUNTER — Ambulatory Visit (INDEPENDENT_AMBULATORY_CARE_PROVIDER_SITE_OTHER): Payer: BC Managed Care – PPO | Admitting: Family Medicine

## 2013-02-02 VITALS — BP 105/72 | HR 78 | Resp 16 | Ht 65.0 in | Wt 163.0 lb

## 2013-02-02 DIAGNOSIS — E663 Overweight: Secondary | ICD-10-CM

## 2013-02-02 MED ORDER — PHENTERMINE-TOPIRAMATE ER 3.75-23 MG PO CP24: 1.0000 | ORAL_CAPSULE | Freq: Every day | ORAL | Status: DC

## 2013-02-02 MED ORDER — PHENTERMINE-TOPIRAMATE ER 7.5-46 MG PO CP24: 1.0000 | ORAL_CAPSULE | Freq: Every day | ORAL | Status: DC

## 2013-02-02 NOTE — Progress Notes (Signed)
  Subjective:    Patient ID: Kayla Powers, female    DOB: [birth year], 57 y.o.   MRN: [id number]  HPI  Kayla Powers is here today for a follow up of her weight loss. She has just completed her 4 th week of the "Step By Step"  Program.  She is taking Phendimetrazine without any problem. She has also been receiving HCG injections without difficulty.  She has been following the 500 calorie diet and has lost 15 lbs since her last visit.    Review of Systems  Constitutional: Negative.   HENT: Negative.   Eyes: Negative.   Respiratory: Negative.   Cardiovascular: Negative.   Gastrointestinal: Negative.   Endocrine: Negative.   Genitourinary: Negative.   Musculoskeletal: Negative.   Skin: Negative.   Allergic/Immunologic: Negative.   Neurological: Negative.   Hematological: Negative.   Psychiatric/Behavioral: Negative.      Past Medical History  Diagnosis Date  . Tachycardia   . GERD (gastroesophageal reflux disease)   . Arrhythmia     tachycardia  . Wears glasses   . Wears hearing aid     left  . Dyspnea   . ADHD (attention deficit hyperactivity disorder)   . Depression   . Arthritis      Family History  Problem Relation Age of Onset  . 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  She quit smoking in 2006.     Alcohol Use:  Occasional   Drug Use:  None   Diet:  Regular   Exercise:  Treadmill 4 x week    Hobbies: Grandchildren/ Reading/ Bible Study/Camping               Objective:   Physical Exam  Vitals reviewed. Constitutional: She is oriented to person, place, and time. She appears well-developed and well-nourished.  Cardiovascular: Normal rate and regular rhythm.    Pulmonary/Chest: Effort normal and breath sounds normal.  Neurological: She is alert and oriented to person, place, and time.  Skin: Skin is warm and dry.  Psychiatric: She has a normal mood and affect.     Assessment & Plan:    Kayla Powers was seen today for her weight.    Diagnoses and associated orders for this visit:  Overweight  She is going to try some Qsymia.    Other Orders - Phentermine-Topiramate (QSYMIA) 3.75-23 MG CP24; Take 1 capsule by mouth daily. - Phentermine-Topiramate (QSYMIA) 7.5-46 MG CP24; Take 1 capsule by mouth daily.

## 2013-02-02 NOTE — Patient Instructions (Addendum)
1)  Weight - Move on to Phase III of the HCG diet limiting your calories to 1000 per day for 4 weeks then you might try the 3 Day Diet till after the 1st of the year.   Try adding the Qsymia to help with appetite suppression.    Exercise to Lose Weight Exercise and a healthy diet may help you lose weight. Your doctor may suggest specific exercises. EXERCISE IDEAS AND TIPS  Choose low-cost things you enjoy doing, such as walking, bicycling, or exercising to workout videos.  Take stairs instead of the elevator.  Walk during your lunch break.  Park your car further away from work or school.  Go to a gym or an exercise class.  Start with 5 to 10 minutes of exercise each day. Build up to 30 minutes of exercise 4 to 6 days a week.  Wear shoes with good support and comfortable clothes.  Stretch before and after working out.  Work out until you breathe harder and your heart beats faster.  Drink extra water when you exercise.  Do not do so much that you hurt yourself, feel dizzy, or get very short of breath. Exercises that burn about 150 calories:  Running 1  miles in 15 minutes.  Playing volleyball for 45 to 60 minutes.  Washing and waxing a car for 45 to 60 minutes.  Playing touch football for 45 minutes.  Walking 1  miles in 35 minutes.  Pushing a stroller 1  miles in 30 minutes.  Playing basketball for 30 minutes.  Raking leaves for 30 minutes.  Bicycling 5 miles in 30 minutes.  Walking 2 miles in 30 minutes.  Dancing for 30 minutes.  Shoveling snow for 15 minutes.  Swimming laps for 20 minutes.  Walking up stairs for 15 minutes.  Bicycling 4 miles in 15 minutes.  Gardening for 30 to 45 minutes.  Jumping rope for 15 minutes.  Washing windows or floors for 45 to 60 minutes. Document Released: 04/19/2010 Document Revised: 06/09/2011 Document Reviewed: 04/19/2010 [facility] Patient Information 2014 [place], [place].

## 2013-02-27 ENCOUNTER — Encounter: Payer: Self-pay | Admitting: Family Medicine

## 2013-03-05 DIAGNOSIS — I10 Essential (primary) hypertension: Secondary | ICD-10-CM | POA: Insufficient documentation

## 2013-03-05 DIAGNOSIS — E663 Overweight: Secondary | ICD-10-CM | POA: Insufficient documentation

## 2013-03-05 DIAGNOSIS — K5900 Constipation, unspecified: Secondary | ICD-10-CM | POA: Insufficient documentation

## 2013-04-04 ENCOUNTER — Encounter: Payer: Self-pay | Admitting: Family Medicine

## 2013-04-18 ENCOUNTER — Ambulatory Visit (INDEPENDENT_AMBULATORY_CARE_PROVIDER_SITE_OTHER): Payer: BC Managed Care – PPO | Admitting: Family Medicine

## 2013-04-18 ENCOUNTER — Encounter: Payer: Self-pay | Admitting: Family Medicine

## 2013-04-18 VITALS — BP 116/78 | HR 87 | Resp 16 | Ht 65.0 in | Wt 158.0 lb

## 2013-04-18 DIAGNOSIS — E663 Overweight: Secondary | ICD-10-CM

## 2013-04-18 MED ORDER — PHENTERMINE-TOPIRAMATE ER 11.25-69 MG PO CP24: 1.0000 | ORAL_CAPSULE | Freq: Every day | ORAL | Status: DC

## 2013-04-18 NOTE — Progress Notes (Signed)
Subjective:    Patient ID: Kayla Powers, female    DOB: Oct 14, 1955, 58 y.o.   MRN: [id number]  HPI  Kayla Powers is here today to begin another round of the Step By Step Diet & Fitness Program.  She has tried numerous diet programs and has not been successful with them.  She feels that she needs to lose weight to improve her general health.  She has been taking Qsymia and feels that it has worked okay for her. She would like to stay on it while she is doing the HCG diet.     Review of Systems  Constitutional: Negative for fatigue.  Respiratory: Negative for chest tightness and shortness of breath.   Cardiovascular: Negative for chest pain.  Psychiatric/Behavioral: Negative for sleep disturbance.     Past Medical History  Diagnosis Date  . Tachycardia   . GERD (gastroesophageal reflux disease)   . Arrhythmia     tachycardia  . Wears glasses   . Wears hearing aid     left  . Dyspnea   . ADHD (attention deficit hyperactivity disorder)   . Depression   . Arthritis      Past Surgical History  Procedure Laterality Date  . Abdominal hysterectomy  1983  . Appendectomy  1971  . Breast implants  1998  . Knee surgery  1985  . Anterior and posterior vaginal repair  2002  . 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  She quit smoking in 2006.     Alcohol Use:  Occasional   Drug Use:  None   Diet:  Regular   Exercise:  Treadmill 4 x week    Hobbies: Grandchildren/ Reading/ Bible Study/Camping              Family  History  Problem Relation Age of Onset  . Alzheimer's disease Mother      Current Outpatient Prescriptions on File Prior to Visit  Medication Sig Dispense Refill  . amitriptyline (ELAVIL) 75 MG tablet Take 1/2 tab at night  90 tablet  1  . atenolol (TENORMIN) 50 MG tablet 50 mg.      . estradiol (MINIVELLE) 0.1 MG/24HR Place 1 patch (0.1 mg total) onto the skin 2 (two) times a week.  24 patch  3  . pantoprazole (PROTONIX) 40 MG tablet Take 1 tablet (40 mg total) by mouth daily.  180 tablet  1  . Phentermine-Topiramate (QSYMIA) 7.5-46 MG CP24 Take 1 capsule by mouth daily.  30 capsule  2  . polyethylene glycol powder (GLYCOLAX/MIRALAX) powder PRN      . VYVANSE 50 MG capsule       . DPH-Lido-AlHydr-MgHydr-Simeth (FIRST-MOUTHWASH BLM) SUSP        No current facility-administered medications on file prior to visit.     Allergies  Allergen Reactions  . Codeine Itching    And vomiting  . Penicillins Other (See Comments)    As a child, unknown reaction      Immunization History  Administered Date(s) Administered  . Zoster 09/28/2012       Objective:   Physical Exam  Vitals reviewed. Constitutional: She is oriented to person, place, and time.  Eyes: Conjunctivae are normal. No scleral icterus.  Neck: Neck supple. No thyromegaly present.  Cardiovascular: Normal rate, regular rhythm and normal heart sounds.   Pulmonary/Chest: Effort normal and breath sounds normal.  Musculoskeletal: She exhibits no edema and no tenderness.  Lymphadenopathy:    She has no cervical adenopathy.  Neurological: She is alert and oriented to person, place, and time.  Skin: Skin is warm and dry.  Psychiatric: She has a normal mood and affect. Her behavior is normal. Judgment and thought content normal.      Assessment & Plan:    Kayla Powers was seen today for weight check.  Diagnoses and associated orders for this visit:  Overweight Comments: She is going to do another round of HCG.  A  prescription was sent to [facility].   - Phentermine-Topiramate (QSYMIA) 11.25-69 MG CP24; Take 1 capsule by mouth daily.

## 2013-04-18 NOTE — Patient Instructions (Signed)
Calorie Counting Diet A calorie counting diet requires you to eat the number of calories that are right for you in a day. Calories are the measurement of how much energy you get from the food you eat. Eating the right amount of calories is important for staying at a healthy weight. If you eat too many calories, your body will store them as fat and you may gain weight. If you eat too few calories, you may lose weight. Counting the number of calories you eat during a day will help you know if you are eating the right amount. A Registered Dietitian can determine how many calories you need in a day. The amount of calories needed varies from person to person. If your goal is to lose weight, you will need to eat fewer calories. Losing weight can benefit you if you are overweight or have health problems such as heart disease, high blood pressure, or diabetes. If your goal is to gain weight, you will need to eat more calories. Gaining weight may be necessary if you have a certain health problem that causes your body to need more energy. TIPS Whether you are increasing or decreasing the number of calories you eat during a day, it may be hard to get used to changes in what you eat and drink. The following are tips to help you keep track of the number of calories you eat.  Measure foods at home with measuring cups. This helps you know the amount of food and number of calories you are eating.  Restaurants often serve food in amounts that are larger than 1 serving. While eating out, estimate how many servings of a food you are given. For example, a serving of cooked rice is  cup or about the size of half of a fist. Knowing serving sizes will help you be aware of how much food you are eating at restaurants.  Ask for smaller portion sizes or child-size portions at restaurants.  Plan to eat half of a meal at a restaurant. Take the rest home or share the other half with a friend.  Read the Nutrition Facts panel on  food labels for calorie content and serving size. You can find out how many servings are in a package, the size of a serving, and the number of calories each serving has.  For example, a package might contain 3 cookies. The Nutrition Facts panel on that package says that 1 serving is 1 cookie. Below that, it will say there are 3 servings in the container. The calories section of the Nutrition Facts label says there are 90 calories. This means there are 90 calories in 1 cookie (1 serving). If you eat 1 cookie you have eaten 90 calories. If you eat all 3 cookies, you have eaten 270 calories (3 servings x 90 calories = 270 calories). The list below tells you how big or small some common portion sizes are.  1 oz.........4 stacked dice.  3 oz.........Deck of cards.  1 tsp........Tip of little finger.  1 tbs........Thumb.  2 tbs........Golf ball.   cup.......Half of a fist.  1 cup........A fist. KEEP A FOOD LOG Write down every food item you eat, the amount you eat, and the number of calories in each food you eat during the day. At the end of the day, you can add up the total number of calories you have eaten. It may help to keep a list like the one below. Find out the calorie information by reading the   Nutrition Facts panel on food labels. Breakfast  Bran cereal (1 cup, 110 calories).  Fat-free milk ( cup, 45 calories). Snack  Apple (1 medium, 80 calories). Lunch  Spinach (1 cup, 20 calories).  Tomato ( medium, 20 calories).  Chicken breast strips (3 oz, 165 calories).  Shredded cheddar cheese ( cup, 110 calories).  Light Italian dressing (2 tbs, 60 calories).  Whole-wheat bread (1 slice, 80 calories).  Tub margarine (1 tsp, 35 calories).  Vegetable soup (1 cup, 160 calories). Dinner  Pork chop (3 oz, 190 calories).  Brown rice (1 cup, 215 calories).  Steamed broccoli ( cup, 20 calories).  Strawberries (1  cup, 65 calories).  Whipped cream (1 tbs, 50  calories). Daily Calorie Total: 1425 Document Released: 03/17/2005 Document Revised: 06/09/2011 Document Reviewed: 09/11/2006 Exit[employer] Patient Information 2014 [employer].  

## 2013-08-31 ENCOUNTER — Encounter: Payer: Self-pay | Admitting: Family Medicine

## 2013-08-31 ENCOUNTER — Ambulatory Visit (INDEPENDENT_AMBULATORY_CARE_PROVIDER_SITE_OTHER): Payer: BC Managed Care – PPO | Admitting: Family Medicine

## 2013-08-31 VITALS — BP 125/82 | HR 84 | Resp 16 | Ht 65.5 in | Wt 148.0 lb

## 2013-08-31 DIAGNOSIS — E663 Overweight: Secondary | ICD-10-CM

## 2013-08-31 MED ORDER — PHENTERMINE-TOPIRAMATE ER 11.25-69 MG PO CP24: 1.0000 | ORAL_CAPSULE | Freq: Every day | ORAL | Status: DC

## 2013-08-31 NOTE — Progress Notes (Signed)
Subjective:    Patient ID: Kayla Powers, female    DOB: 1956/02/25, 58 y.o.   MRN: [id number]  HPI  [name] is here today to discuss her weight. She is wanting to get about 10 more pounds off. She thinks she wants to do another round of the HCG Step by Step program.   Review of Systems  Constitutional: Negative for activity change, appetite change, fatigue and unexpected weight change.  Cardiovascular: Negative for chest pain, palpitations and leg swelling.  All other systems reviewed and are negative.    Past Medical History  Diagnosis Date  . Tachycardia   . GERD (gastroesophageal reflux disease)   . Arrhythmia     tachycardia  . Wears glasses   . Wears hearing aid     left  . Dyspnea   . ADHD (attention deficit hyperactivity disorder)   . Depression   . Arthritis      Past Surgical History  Procedure Laterality Date  . Abdominal hysterectomy  1983  . Appendectomy  1971  . Breast implants  1998  . Knee surgery  1985  . Anterior and posterior vaginal repair  2002  . 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  She quit smoking in 2006.     Alcohol Use:  Occasional   Drug Use:  None   Diet:  Regular   Exercise:  Treadmill 4 x week    Hobbies: Grandchildren/ Reading/ Bible Study/Camping              Family History  Problem Relation Age of Onset  . Alzheimer's disease Mother      Current Outpatient Prescriptions on File Prior to Visit  Medication Sig Dispense Refill  . amitriptyline (ELAVIL)  75 MG tablet Take 1/2 tab at night  90 tablet  1  . atenolol (TENORMIN) 50 MG tablet 50 mg.      . estradiol (MINIVELLE) 0.1 MG/24HR Place 1 patch (0.1 mg total) onto the skin 2 (two) times a week.  24 patch  3  . pantoprazole (PROTONIX) 40 MG tablet Take 1 tablet (40 mg total) by mouth daily.  180 tablet  1  . polyethylene glycol powder (GLYCOLAX/MIRALAX) powder PRN      . VYVANSE 50 MG capsule        No current facility-administered medications on file prior to visit.     Allergies  Allergen Reactions  . Codeine Itching    And vomiting  . Penicillins Other (See Comments)    As a child, unknown reaction      Immunization History  Administered Date(s) Administered  . Zoster 09/28/2012       Objective:   Physical Exam  Nursing note and vitals reviewed. Constitutional: She is oriented to person, place, and time.  Eyes: Conjunctivae are normal. No scleral icterus.  Neck: Neck supple. No thyromegaly present.  Cardiovascular: Normal rate, regular rhythm and normal heart sounds.   Pulmonary/Chest: Effort normal and breath sounds normal.  Musculoskeletal:  She exhibits no edema and no tenderness.  Lymphadenopathy:    She has no cervical adenopathy.  Neurological: She is alert and oriented to person, place, and time.  Skin: Skin is warm and dry.  Psychiatric: She has a normal mood and affect. Her behavior is normal. Judgment and thought content normal.      Assessment & Plan:    [name] was seen today for weight check.  Diagnoses and associated orders for this visit:  Overweight(278.02) Comments: She is going to do another round of HCG.  A prescription was sent to [facility].   - Phentermine-Topiramate (QSYMIA) 11.25-69 MG CP24; Take 1 capsule by mouth daily.

## 2013-10-09 DIAGNOSIS — E663 Overweight: Secondary | ICD-10-CM | POA: Insufficient documentation

## 2013-10-14 ENCOUNTER — Encounter: Payer: Self-pay | Admitting: Family Medicine

## 2013-10-14 ENCOUNTER — Ambulatory Visit (INDEPENDENT_AMBULATORY_CARE_PROVIDER_SITE_OTHER): Payer: BC Managed Care – PPO | Admitting: Family Medicine

## 2013-10-14 VITALS — BP 122/82 | HR 92 | Resp 16 | Ht 65.5 in | Wt 138.0 lb

## 2013-10-14 DIAGNOSIS — K5900 Constipation, unspecified: Secondary | ICD-10-CM

## 2013-10-14 DIAGNOSIS — I10 Essential (primary) hypertension: Secondary | ICD-10-CM

## 2013-10-14 DIAGNOSIS — N951 Menopausal and female climacteric states: Secondary | ICD-10-CM

## 2013-10-14 DIAGNOSIS — K219 Gastro-esophageal reflux disease without esophagitis: Secondary | ICD-10-CM

## 2013-10-14 DIAGNOSIS — G510 Bell's palsy: Secondary | ICD-10-CM

## 2013-10-14 DIAGNOSIS — G519 Disorder of facial nerve, unspecified: Secondary | ICD-10-CM

## 2013-10-14 DIAGNOSIS — Z780 Asymptomatic menopausal state: Secondary | ICD-10-CM

## 2013-10-14 MED ORDER — AMITRIPTYLINE HCL 75 MG PO TABS: ORAL_TABLET | ORAL | Status: AC

## 2013-10-14 MED ORDER — ACARBOSE 50 MG PO TABS: 50.0000 mg | ORAL_TABLET | Freq: Three times a day (TID) | ORAL | Status: DC

## 2013-10-14 MED ORDER — AMITRIPTYLINE HCL 75 MG PO TABS: ORAL_TABLET | ORAL | Status: DC

## 2013-10-14 MED ORDER — ESTRADIOL 0.1 MG/24HR TD PTTW: 1.0000 | MEDICATED_PATCH | TRANSDERMAL | Status: DC

## 2013-10-14 MED ORDER — PANTOPRAZOLE SODIUM 40 MG PO TBEC: 40.0000 mg | DELAYED_RELEASE_TABLET | Freq: Every day | ORAL | Status: DC

## 2013-10-14 MED ORDER — ATENOLOL 50 MG PO TABS: 50.0000 mg | ORAL_TABLET | Freq: Every day | ORAL | Status: AC

## 2013-10-14 NOTE — Progress Notes (Signed)
Subjective:    Patient ID: Kayla Powers, female    DOB: 1955/08/05, 58 y.o.   MRN: [id number]  HPI  Kayla Powers is here today to follow up on her medications. She is needing several refills. We are going over the following:  1)  Hypertension:  She is doing well on the atenolol. Her blood pressure looks good.  2)  GERD:  Well controlled on the Protonix.  3)  Post Menopause:  She is doing well on the estradiol patch.  4)  Fascial Neuropathy:  She does well on the amitriptyline. She is completely out and needs a refill.    Review of Systems  Constitutional: Negative for activity change, appetite change and fatigue.  Cardiovascular: Negative for chest pain, palpitations and leg swelling.  Psychiatric/Behavioral: Negative for behavioral problems. The patient is not nervous/anxious.   All other systems reviewed and are negative.    Past Medical History  Diagnosis Date  . Tachycardia   . GERD (gastroesophageal reflux disease)   . Arrhythmia     tachycardia  . Wears glasses   . Wears hearing aid     left  . Dyspnea   . ADHD (attention deficit hyperactivity disorder)   . Depression   . Arthritis      Past Surgical History  Procedure Laterality Date  . Abdominal hysterectomy  1983  . Appendectomy  1971  . Breast implants  1998  . Knee surgery  1985  . Anterior and posterior vaginal repair  2002  . 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  She quit smoking in 2006.     Alcohol Use:  Occasional   Drug Use:   None   Diet:  Regular   Exercise:  Treadmill 4 x week    Hobbies: Grandchildren/ Reading/ Bible Study/Camping              Family History  Problem Relation Age of Onset  . Alzheimer's disease Mother      Current Outpatient Prescriptions on File Prior to Visit  Medication Sig Dispense Refill  . polyethylene glycol powder (GLYCOLAX/MIRALAX) powder PRN      . VYVANSE 50 MG capsule        No current facility-administered medications on file prior to visit.     Allergies  Allergen Reactions  . Codeine Itching    And vomiting  . Penicillins Other (See Comments)    As a child, unknown reaction      Immunization History  Administered Date(s) Administered  . Zoster 09/28/2012       Objective:   Physical Exam  Vitals reviewed. Constitutional: She is oriented to person, place, and time. She appears well-developed and well-nourished.  Eyes: Conjunctivae are normal. No scleral icterus.  Neck: Neck supple. No thyromegaly present.  Cardiovascular: Normal rate, regular rhythm and normal heart sounds.   Pulmonary/Chest: Effort normal and breath sounds normal.  Musculoskeletal: She exhibits no edema and no tenderness.  Lymphadenopathy:    She has no  cervical adenopathy.  Neurological: She is alert and oriented to person, place, and time.  Skin: Skin is warm and dry.  Psychiatric: She has a normal mood and affect. Her behavior is normal. Judgment and thought content normal.      Assessment & Plan:    Kayla Powers was seen today for follow-up.  Diagnoses and associated orders for this visit:  Essential hypertension, benign - atenolol (TENORMIN) 50 MG tablet; Take 1 tablet (50 mg total) by mouth daily.  Gastroesophageal reflux disease without esophagitis - pantoprazole (PROTONIX) 40 MG tablet; Take 1 tablet (40 mg total) by mouth daily.  Facial neuropathy - amitriptyline (ELAVIL) 75 MG tablet; Take 1/2 tab at night  Menopause - estradiol (MINIVELLE) 0.1 MG/24HR patch;  Place 1 patch (0.1 mg total) onto the skin 2 (two) times a week.  Unspecified constipation - acarbose (PRECOSE) 50 MG tablet; Take 1 tablet (50 mg total) by mouth 3 (three) times daily with meals.   TIME SPENT "FACE TO FACE" WITH PATIENT -  30 MINS

## 2014-01-13 ENCOUNTER — Other Ambulatory Visit: Payer: Self-pay

## 2014-08-16 ENCOUNTER — Other Ambulatory Visit: Payer: Self-pay | Admitting: Obstetrics and Gynecology

## 2014-08-17 LAB — CYTOLOGY - PAP

## 2014-09-13 DIAGNOSIS — G500 Trigeminal neuralgia: Secondary | ICD-10-CM | POA: Insufficient documentation

## 2015-06-07 ENCOUNTER — Telehealth: Payer: Self-pay | Admitting: *Deleted

## 2015-06-07 NOTE — Telephone Encounter (Signed)
Pt asked for diagnosis and procedure codes for treating toenail fungus for her insurance.  I told pt onychomycosis code is B35.1, but there is not laser code at this time because it is not covered by insurance.  Pt states she wanted to know the cost, and I quoted the laser cost of $100.00/month for 6 months of treatment totalling $600.00 as stated in office communication of 06/06/2015.  I offered pt an appt and she stated she would call again.

## 2017-09-03 DIAGNOSIS — E669 Obesity, unspecified: Secondary | ICD-10-CM | POA: Insufficient documentation

## 2017-09-03 DIAGNOSIS — Z0000 Encounter for general adult medical examination without abnormal findings: Secondary | ICD-10-CM | POA: Insufficient documentation

## 2017-09-03 DIAGNOSIS — Z1159 Encounter for screening for other viral diseases: Secondary | ICD-10-CM | POA: Insufficient documentation

## 2017-09-03 DIAGNOSIS — F909 Attention-deficit hyperactivity disorder, unspecified type: Secondary | ICD-10-CM | POA: Insufficient documentation

## 2017-09-03 DIAGNOSIS — R5383 Other fatigue: Secondary | ICD-10-CM | POA: Insufficient documentation

## 2017-09-16 DIAGNOSIS — E038 Other specified hypothyroidism: Secondary | ICD-10-CM | POA: Insufficient documentation

## 2017-09-16 DIAGNOSIS — E039 Hypothyroidism, unspecified: Secondary | ICD-10-CM | POA: Insufficient documentation

## 2017-12-10 ENCOUNTER — Emergency Department (HOSPITAL_COMMUNITY)
Admission: EM | Admit: 2017-12-10 | Discharge: 2017-12-10 | Discharge status: Against Medical Advice | Payer: BLUE CROSS/BLUE SHIELD | Attending: Emergency Medicine | Admitting: Emergency Medicine

## 2017-12-10 ENCOUNTER — Encounter (HOSPITAL_COMMUNITY): Payer: Self-pay | Admitting: Emergency Medicine

## 2017-12-10 DIAGNOSIS — Z5321 Procedure and treatment not carried out due to patient leaving prior to being seen by health care provider: Secondary | ICD-10-CM | POA: Diagnosis not present

## 2017-12-10 DIAGNOSIS — R42 Dizziness and giddiness: Secondary | ICD-10-CM | POA: Diagnosis present

## 2017-12-10 LAB — CBG MONITORING, ED: Glucose-Capillary: 104 mg/dL — ABNORMAL HIGH (ref 70–99)

## 2017-12-10 NOTE — ED Triage Notes (Signed)
Pt was at work and got dizzy/lightheaded stated that she went up stairs to heat up her lunch and felt like going to vomit and stomach hurt so didn't eat. Reports ate cucumber and orange today. SO reports that pt is on diet and HCG shots at home. Today got shingle shot.

## 2017-12-10 NOTE — ED Notes (Signed)
Pt informed registration that they were leaving and gave their stickers back to them

## 2018-06-04 ENCOUNTER — Ambulatory Visit: Payer: BLUE CROSS/BLUE SHIELD | Admitting: Podiatry

## 2019-01-31 ENCOUNTER — Other Ambulatory Visit: Payer: Self-pay

## 2019-01-31 ENCOUNTER — Ambulatory Visit (INDEPENDENT_AMBULATORY_CARE_PROVIDER_SITE_OTHER): Payer: BLUE CROSS/BLUE SHIELD | Admitting: Podiatry

## 2019-01-31 DIAGNOSIS — L603 Nail dystrophy: Secondary | ICD-10-CM

## 2019-01-31 DIAGNOSIS — B351 Tinea unguium: Secondary | ICD-10-CM

## 2019-01-31 DIAGNOSIS — M79676 Pain in unspecified toe(s): Secondary | ICD-10-CM

## 2019-01-31 DIAGNOSIS — S99921A Unspecified injury of right foot, initial encounter: Secondary | ICD-10-CM

## 2019-01-31 MED ORDER — FLUCONAZOLE 150 MG PO TABS: 150.0000 mg | ORAL_TABLET | ORAL | 2 refills | Status: DC

## 2019-01-31 NOTE — Progress Notes (Signed)
  Subjective:  Patient ID: Kayla Powers, female    DOB: 18-Jul-1955,  MRN: [id number]  Chief Complaint  Patient presents with  . Nail Problem    Rt hallux nail problem/injury x last week (kicked chair and king of wripped half toenail off); no pain -w/ rendess and loose -pt states was very mad and inflammed."     63 y.o. female presents with the above complaint. Hx as above.  Review of Systems: Negative except as noted in the HPI. Denies N/V/F/Ch.  Past Medical History:  Diagnosis Date  . ADHD (attention deficit hyperactivity disorder)   . Arrhythmia    tachycardia  . Arthritis   . Depression   . Dyspnea   . GERD (gastroesophageal reflux disease)   . Tachycardia   . Wears glasses   . Wears hearing aid    left    Current Outpatient Medications:  .  amitriptyline (ELAVIL) 75 MG tablet, Take 1/2 tab at night, Disp: 90 tablet, Rfl: 1 .  amphetamine-dextroamphetamine (ADDERALL) 5 MG tablet, Take 5 mg by mouth daily., Disp: , Rfl:  .  atenolol (TENORMIN) 50 MG tablet, Take 1 tablet (50 mg total) by mouth daily., Disp: 90 tablet, Rfl: 3 .  estradiol (MINIVELLE) 0.1 MG/24HR patch, Place 1 patch (0.1 mg total) onto the skin 2 (two) times a week., Disp: 8 patch, Rfl: 11 .  meloxicam (MOBIC) 7.5 MG tablet, Take by mouth., Disp: , Rfl:  .  polyethylene glycol powder (GLYCOLAX/MIRALAX) powder, PRN, Disp: , Rfl:  .  fluconazole (DIFLUCAN) 150 MG tablet, Take 1 tablet (150 mg total) by mouth once a week., Disp: 4 tablet, Rfl: 2  Social History   Tobacco Use  Smoking Status Former Smoker  . Packs/day: 1.00  . Years: 32.00  . Pack years: 32.00  . Types: Cigarettes  . Quit date: 04/02/2004  . Years since quitting: 14.8  Smokeless Tobacco Never Used    Allergies  Allergen Reactions  . Codeine Itching    And vomiting  . Penicillins Other (See Comments)    As a child, unknown reaction    Objective:  There were no vitals filed for this visit. There is no height or weight on file  to calculate BMI. Constitutional Well developed. Well nourished.  Vascular Dorsalis pedis pulses palpable bilaterally. Posterior tibial pulses palpable bilaterally. Capillary refill normal to all digits.  No cyanosis or clubbing noted. Pedal hair growth normal.  Neurologic Normal speech. Oriented to person, place, and time. Epicritic sensation to light touch grossly present bilaterally.  Dermatologic Nail right hallux with nail transverse lysis, white discoloration, pain to palpation. Superficial white nail type distally.  No open wounds. No skin lesions.  Orthopedic: Normal joint ROM without pain or crepitus bilaterally. No visible deformities. No bony tenderness.   Radiographs: None Assessment:   1. Onychomycosis   2. Nail dystrophy   3. Injury of toenail of right foot, initial encounter   4. Pain around toenail    Plan:  Patient was evaluated and treated and all questions answered.  Onychomycosis -Educated on etiology of nail fungus. -Nail gently debrided of nail -eRx for weekly fluconazole. Educated on use.  Return in about 5 weeks (around 03/07/2019) for Nail Fungus.

## 2019-02-11 ENCOUNTER — Ambulatory Visit: Payer: BLUE CROSS/BLUE SHIELD | Admitting: Podiatry

## 2019-02-17 ENCOUNTER — Ambulatory Visit: Payer: BLUE CROSS/BLUE SHIELD | Admitting: Podiatry

## 2019-03-14 ENCOUNTER — Ambulatory Visit: Payer: Self-pay | Admitting: Podiatry

## 2019-03-22 ENCOUNTER — Emergency Department (HOSPITAL_COMMUNITY): Payer: BC Managed Care – PPO

## 2019-03-22 ENCOUNTER — Emergency Department (HOSPITAL_COMMUNITY)
Admission: EM | Admit: 2019-03-22 | Discharge: 2019-03-22 | Disposition: A | Discharge status: Home / Self Care | Payer: BC Managed Care – PPO | Attending: Emergency Medicine | Admitting: Emergency Medicine

## 2019-03-22 ENCOUNTER — Other Ambulatory Visit: Payer: Self-pay

## 2019-03-22 ENCOUNTER — Encounter (HOSPITAL_COMMUNITY): Payer: Self-pay | Admitting: Emergency Medicine

## 2019-03-22 DIAGNOSIS — E039 Hypothyroidism, unspecified: Secondary | ICD-10-CM | POA: Insufficient documentation

## 2019-03-22 DIAGNOSIS — Z79899 Other long term (current) drug therapy: Secondary | ICD-10-CM | POA: Diagnosis not present

## 2019-03-22 DIAGNOSIS — Z87891 Personal history of nicotine dependence: Secondary | ICD-10-CM | POA: Insufficient documentation

## 2019-03-22 DIAGNOSIS — W208XXA Other cause of strike by thrown, projected or falling object, initial encounter: Secondary | ICD-10-CM | POA: Insufficient documentation

## 2019-03-22 DIAGNOSIS — Y939 Activity, unspecified: Secondary | ICD-10-CM | POA: Diagnosis not present

## 2019-03-22 DIAGNOSIS — Y929 Unspecified place or not applicable: Secondary | ICD-10-CM | POA: Insufficient documentation

## 2019-03-22 DIAGNOSIS — S99922A Unspecified injury of left foot, initial encounter: Secondary | ICD-10-CM | POA: Diagnosis present

## 2019-03-22 DIAGNOSIS — Y999 Unspecified external cause status: Secondary | ICD-10-CM | POA: Insufficient documentation

## 2019-03-22 DIAGNOSIS — I10 Essential (primary) hypertension: Secondary | ICD-10-CM | POA: Diagnosis not present

## 2019-03-22 DIAGNOSIS — S9032XA Contusion of left foot, initial encounter: Secondary | ICD-10-CM | POA: Insufficient documentation

## 2019-03-22 NOTE — Discharge Instructions (Signed)
Please read and follow all provided instructions.  Your diagnoses today include:  1. Contusion of left foot, initial encounter     Tests performed today include:  An x-ray of the affected area - does NOT show any broken bones  Vital signs. See below for your results today.   Medications prescribed:  Please use over-the-counter NSAID medications (ibuprofen, naproxen) as directed on the packaging for pain.   Take any prescribed medications only as directed.  Home care instructions:   Follow any educational materials contained in this packet  Follow R.I.C.E. Protocol:  R - rest your injury   I  - use ice on injury without applying directly to skin  C - compress injury with bandage or splint  E - elevate the injury as much as possible  Follow-up instructions: Please follow-up with your primary care provider.   Return instructions:   Please return if your toes or feet are numb or tingling, appear gray or blue, or you have severe pain (also elevate the leg and loosen splint or wrap if you were given one)  Please return to the Emergency Department if you experience worsening symptoms.   Please return if you have any other emergent concerns.  Additional Information:  Your vital signs today were: BP (!) 149/92 (BP Location: Left Arm)   Pulse 94   Temp 97.9 F (36.6 C) (Oral)   Resp 16   Ht 5\' 6"  (1.676 m)   Wt 78.5 kg   SpO2 100%   BMI 27.92 kg/m  If your blood pressure (BP) was elevated above 135/85 this visit, please have this repeated by your doctor within one month. --------------

## 2019-03-22 NOTE — ED Provider Notes (Signed)
[facility] DEPT Provider Note   CSN: [id number] Arrival date & time: 03/22/19  2038     History Chief Complaint  Patient presents with  . Foot Pain    Kayla Powers is a 63 y.o. female.  Patient presents to the emergency department with swelling to the top of her left foot starting acutely just prior to arrival.  Patient states that a large jar of peanut butter fell from a great height directly onto her foot.  She had immediate pain and difficulty bearing weight on the foot.  She took 800 mg of ibuprofen prior to arrival.  She denies any ankle or knee pain.  She did not fall.        Past Medical History:  Diagnosis Date  . ADHD (attention deficit hyperactivity disorder)   . Arrhythmia    tachycardia  . Arthritis   . Depression   . Dyspnea   . GERD (gastroesophageal reflux disease)   . Tachycardia   . Wears glasses   . Wears hearing aid    left    Patient Active Problem List   Diagnosis Date Noted  . Subclinical hypothyroidism 09/16/2017  . ADHD (attention deficit hyperactivity disorder) 09/03/2017  . Fatigue 09/03/2017  . Need for hepatitis C screening test 09/03/2017  . Obesity without serious comorbidity 09/03/2017  . Well woman exam (no gynecological exam) 09/03/2017  . Trigeminal neuralgia of left side of face 09/13/2014  . Overweight(278.02) 10/09/2013  . Essential hypertension, benign 03/05/2013  . Unspecified constipation 03/05/2013  . Overweight 03/05/2013  . GERD (gastroesophageal reflux disease) 09/04/2012  . Menopause syndrome 09/04/2012  . Tachycardia 09/04/2012  . Facial neuropathy 09/04/2012  . Concentration deficit 09/04/2012  . Symptomatic cholelithiasis 06/28/2012    Past Surgical History:  Procedure Laterality Date  . ABDOMINAL HYSTERECTOMY  1983  . ANTERIOR AND POSTERIOR VAGINAL REPAIR  2002  . APPENDECTOMY  1971  . breast implants  1998  . CHOLECYSTECTOMY N/A 07/08/2012   Procedure: LAPAROSCOPIC  CHOLECYSTECTOMY possible IOC ;  Surgeon: Harl Bowie, MD;  Location: [facility];  Service: General;  Laterality: N/A;  . [place]     OB History   No obstetric history on file.     Family History  Problem Relation Age of Onset  . Alzheimer's disease Mother     Social History   Tobacco Use  . Smoking status: Former Smoker    Packs/day: 1.00    Years: 32.00    Pack years: 32.00    Types: Cigarettes    Quit date: 04/02/2004    Years since quitting: 14.9  . Smokeless tobacco: Never Used  Substance Use Topics  . Alcohol use: Yes    Comment: Occasional  . Drug use: No    Home Medications Prior to Admission medications   Medication Sig Start Date End Date Taking? Authorizing Provider  amitriptyline (ELAVIL) 75 MG tablet Take 1/2 tab at night 10/14/13 01/31/19  Zanard, Bernadene Bell, MD  amphetamine-dextroamphetamine (ADDERALL) 5 MG tablet Take 5 mg by mouth daily.    [provider]  atenolol (TENORMIN) 50 MG tablet Take 1 tablet (50 mg total) by mouth daily. 10/14/13 01/31/19  Jonathon Resides, MD  estradiol (MINIVELLE) 0.1 MG/24HR patch Place 1 patch (0.1 mg total) onto the skin 2 (two) times a week. 10/14/13 01/31/19  Jonathon Resides, MD  fluconazole (DIFLUCAN) 150 MG tablet Take 1 tablet (150 mg total) by mouth once a week.  01/31/19   Park LiterPrice, Michael J, DPM  meloxicam (MOBIC) 7.5 MG tablet Take by mouth. 01/04/19   [provider]  polyethylene glycol powder (GLYCOLAX/MIRALAX) powder PRN 06/12/12   [provider]    Allergies    Codeine and Penicillins  Review of Systems   Review of Systems  Constitutional: Negative for activity change.  Musculoskeletal: Positive for arthralgias, gait problem and joint swelling. Negative for back pain and neck pain.  Skin: Negative for wound.  Neurological: Negative for weakness and numbness.    Physical Exam Updated Vital Signs BP (!) 149/92 (BP Location: Left Arm)   Pulse 94   Temp 97.9  F (36.6 C) (Oral)   Resp 16   Ht 5\' 6"  (1.676 m)   Wt 78.5 kg   SpO2 100%   BMI 27.92 kg/m   Physical Exam Vitals and nursing note reviewed.  Constitutional:      Appearance: She is well-developed.  HENT:     Head: Normocephalic and atraumatic.  Eyes:     Pupils: Pupils are equal, round, and reactive to light.  Cardiovascular:     Pulses: Normal pulses. No decreased pulses.  Musculoskeletal:        General: Tenderness present.     Cervical back: Normal range of motion and neck supple.     Left knee: Normal.     Left ankle: Normal.     Left foot: Decreased range of motion. Swelling and tenderness present. Normal pulse.       Legs:  Skin:    General: Skin is warm and dry.  Neurological:     Mental Status: She is alert.     Sensory: No sensory deficit.     Comments: Motor, sensation, and vascular distal to the injury is fully intact.      ED Results / Procedures / Treatments   Labs (all labs ordered are listed, but only abnormal results are displayed) Labs Reviewed - No data to display  EKG None  Radiology DG Foot Complete Left  Result Date: 03/22/2019 CLINICAL DATA:  Pain dropped jar of peanut butter EXAM: LEFT FOOT - COMPLETE 3+ VIEW COMPARISON:  None. FINDINGS: No acute fracture or dislocation. There is a nonunited prior injury of the medial malleolus. Diffuse dorsal soft tissue swelling is seen. Normal bone mineralization seen throughout. IMPRESSION: No acute osseous abnormality. Electronically Signed   By: Jonna ClarkBindu  Avutu M.D.   On: 03/22/2019 21:30    Procedures Procedures (including critical care time)  Medications Ordered in ED Medications - No data to display  ED Course  I have reviewed the triage vital signs and the nursing notes.  Pertinent labs & imaging results that were available during my care of the patient were reviewed by me and considered in my medical decision making (see chart for details).  Patient seen and examined.  X-ray reviewed.  No  tenderness to the area of previous medial malleolus injury.  No fractures noted.  Ace wrap will be placed.  Patient states that she has crutches to use at home and declines crutches here.  We discussed rice protocol, NSAIDs/Tylenol.  Discussed need for PCP follow-up if she is not recovering in 1 week  Vital signs reviewed and are as follows: BP (!) 149/92 (BP Location: Left Arm)   Pulse 94   Temp 97.9 F (36.6 C) (Oral)   Resp 16   Ht 5\' 6"  (1.676 m)   Wt 78.5 kg   SpO2 100%   BMI 27.92 kg/m  MDM Rules/Calculators/A&P                      Patient with left foot contusion.  No blood thinners.  Distal sensation intact.  No neurovascular compromise detected.  Imaging without acute fracture.  Conservative measures indicated at this time.  Patient provided with an Ace wrap.   Final Clinical Impression(s) / ED Diagnoses Final diagnoses:  Contusion of left foot, initial encounter    Rx / DC Orders ED Discharge Orders    None       Renne Crigler, Cordelia Poche 03/22/19 2238    Terrilee Files, MD 03/23/19 1226

## 2019-03-22 NOTE — ED Triage Notes (Signed)
Patient dropped a large jar of peanut butter on her left foot. She now has pain and swelling at that site of the injury.

## 2019-04-11 ENCOUNTER — Other Ambulatory Visit: Payer: Self-pay

## 2019-04-11 DIAGNOSIS — I10 Essential (primary) hypertension: Secondary | ICD-10-CM | POA: Insufficient documentation

## 2019-04-11 DIAGNOSIS — M8580 Other specified disorders of bone density and structure, unspecified site: Secondary | ICD-10-CM | POA: Insufficient documentation

## 2019-04-12 ENCOUNTER — Other Ambulatory Visit: Payer: Self-pay

## 2019-04-12 ENCOUNTER — Ambulatory Visit (INDEPENDENT_AMBULATORY_CARE_PROVIDER_SITE_OTHER): Payer: BC Managed Care – PPO | Admitting: Podiatry

## 2019-04-12 DIAGNOSIS — B351 Tinea unguium: Secondary | ICD-10-CM | POA: Diagnosis not present

## 2019-04-12 DIAGNOSIS — S9032XA Contusion of left foot, initial encounter: Secondary | ICD-10-CM | POA: Diagnosis not present

## 2019-04-12 DIAGNOSIS — L603 Nail dystrophy: Secondary | ICD-10-CM | POA: Diagnosis not present

## 2019-04-12 NOTE — Progress Notes (Signed)
  Subjective:  Patient ID: Kayla Powers, female    DOB: 23-Jul-1955,  MRN: [id number]  Chief Complaint  Patient presents with  . Nail Problem    F/U Rt hallux nail fungus Pt. states," it's improving, almost back to normal." -pt denies redness/swellgin/reaction to medication Tx: flucanazole   . Foot Injury    injury to Lt dorsal foot x Dec 22nd (dropped peanut jar) -pt states she went to ER and was dx with Hematoma   64 y.o. female presents with the above complaint. History confirmed with patient.   Objective:  Physical Exam: nail exam normal nails without lesions and microcirc dz consistent with Raynaud's. Left Foot: left foot edema, contusion, painful mass dorsal foot    Assessment:   1. Onychomycosis   2. Nail dystrophy   3. Hematoma of left foot    Plan:  Patient was evaluated and treated and all questions answered.  Onychomycosis -Improving -Take Fluconazole to completion -No need for refill -F/u should issues persist  Hematoma Left Foot -Educated on warm soaks -F/u should it not resolve within the month. Discussed the area may need to get evacuated.  No follow-ups on file.

## 2019-05-24 ENCOUNTER — Ambulatory Visit: Payer: BC Managed Care – PPO | Admitting: Podiatry

## 2019-05-25 ENCOUNTER — Telehealth: Payer: Self-pay | Admitting: Nurse Practitioner

## 2019-05-25 ENCOUNTER — Other Ambulatory Visit: Payer: Self-pay | Admitting: Nurse Practitioner

## 2019-05-25 DIAGNOSIS — I10 Essential (primary) hypertension: Secondary | ICD-10-CM

## 2019-05-25 DIAGNOSIS — U071 COVID-19: Secondary | ICD-10-CM

## 2019-05-25 NOTE — Telephone Encounter (Signed)
  I connected by phone with Kayla Powers on 05/25/2019 at 8:07 PM to discuss the potential use of an new treatment for mild to moderate COVID-19 viral infection in non-hospitalized patients.  This patient is a 64 y.o. female that meets the FDA criteria for Emergency Use Authorization of bamlanivimab or casirivimab\imdevimab.  Has a (+) direct SARS-CoV-2 viral test result  Has mild or moderate COVID-19   Is ? 63 years of age and weighs ? 40 kg  Is NOT hospitalized due to COVID-19  Is NOT requiring oxygen therapy or requiring an increase in baseline oxygen flow rate due to COVID-19  Is within 10 days of symptom onset  Has at least one of the high risk factor(s) for progression to severe COVID-19 and/or hospitalization as defined in EUA.  Specific high risk criteria : HTN   I have spoken and communicated the following to the patient or parent/caregiver:  1. FDA has authorized the emergency use of bamlanivimab and casirivimab\imdevimab for the treatment of mild to moderate COVID-19 in adults and pediatric patients with positive results of direct SARS-CoV-2 viral testing who are 14 years of age and older weighing at least 40 kg, and who are at high risk for progressing to severe COVID-19 and/or hospitalization.  2. The significant known and potential risks and benefits of bamlanivimab and casirivimab\imdevimab, and the extent to which such potential risks and benefits are unknown.  3. Information on available alternative treatments and the risks and benefits of those alternatives, including clinical trials.  4. Patients treated with bamlanivimab and casirivimab\imdevimab should continue to self-isolate and use infection control measures (e.g., wear mask, isolate, social distance, avoid sharing personal items, clean and disinfect "high touch" surfaces, and frequent handwashing) according to CDC guidelines.   5. The patient or parent/caregiver has the option to accept or refuse  bamlanivimab or casirivimab\imdevimab .  After reviewing this information with the patient, agreed. Albertina Senegal [facility] 05/25/2019 8:07 PM

## 2019-05-26 ENCOUNTER — Ambulatory Visit (HOSPITAL_COMMUNITY)
Admission: RE | Admit: 2019-05-26 | Discharge: 2019-05-26 | Disposition: A | Discharge status: Home / Self Care | Payer: BC Managed Care – PPO | Source: Ambulatory Visit | Attending: Pulmonary Disease | Admitting: Pulmonary Disease

## 2019-05-26 DIAGNOSIS — U071 COVID-19: Secondary | ICD-10-CM | POA: Diagnosis not present

## 2019-05-26 DIAGNOSIS — I10 Essential (primary) hypertension: Secondary | ICD-10-CM | POA: Insufficient documentation

## 2019-05-26 MED ORDER — ALBUTEROL SULFATE HFA 108 (90 BASE) MCG/ACT IN AERS: 2.0000 | INHALATION_SPRAY | Freq: Once | RESPIRATORY_TRACT | Status: DC | PRN

## 2019-05-26 MED ORDER — METHYLPREDNISOLONE SODIUM SUCC 125 MG IJ SOLR: 125.0000 mg | Freq: Once | INTRAMUSCULAR | Status: DC | PRN

## 2019-05-26 MED ORDER — SODIUM CHLORIDE 0.9 % IV SOLN
INTRAVENOUS | Status: DC | PRN
  Administered 2019-05-26: 250 mL via INTRAVENOUS

## 2019-05-26 MED ORDER — DIPHENHYDRAMINE HCL 50 MG/ML IJ SOLN: 50.0000 mg | Freq: Once | INTRAMUSCULAR | Status: DC | PRN

## 2019-05-26 MED ORDER — FAMOTIDINE IN NACL 20-0.9 MG/50ML-% IV SOLN: 20.0000 mg | Freq: Once | INTRAVENOUS | Status: DC | PRN

## 2019-05-26 MED ORDER — SODIUM CHLORIDE 0.9 % IV SOLN
700.0000 mg | Freq: Once | INTRAVENOUS | Status: AC
  Administered 2019-05-26: 11:00:00 700 mg via INTRAVENOUS
  Filled 2019-05-26: qty 20

## 2019-05-26 MED ORDER — EPINEPHRINE 0.3 MG/0.3ML IJ SOAJ: 0.3000 mg | Freq: Once | INTRAMUSCULAR | Status: DC | PRN

## 2019-05-26 NOTE — Progress Notes (Signed)
Patient ID: Kayla Powers, female   DOB: [birth year], 64 y.o.   MRN: [id number]  Diagnosis: COVID-19  Physician:dr wright  Procedure: [facility] Med: bamlanivimab infusion - Provided patient with bamlanimivab fact sheet for patients, parents and caregivers prior to infusion.  Complications: No immediate complications noted.  Discharge: Discharged home   Deyani Hegarty S Yenifer Saccente 05/26/2019

## 2019-05-26 NOTE — Discharge Instructions (Signed)

## 2019-06-07 ENCOUNTER — Other Ambulatory Visit: Payer: Self-pay

## 2019-06-07 ENCOUNTER — Ambulatory Visit (INDEPENDENT_AMBULATORY_CARE_PROVIDER_SITE_OTHER): Payer: BC Managed Care – PPO | Admitting: Podiatry

## 2019-06-07 DIAGNOSIS — I7300 Raynaud's syndrome without gangrene: Secondary | ICD-10-CM

## 2019-06-07 DIAGNOSIS — M778 Other enthesopathies, not elsewhere classified: Secondary | ICD-10-CM | POA: Diagnosis not present

## 2019-06-07 DIAGNOSIS — S9032XA Contusion of left foot, initial encounter: Secondary | ICD-10-CM

## 2019-06-07 NOTE — Progress Notes (Signed)
  Subjective:  Patient ID: Kayla Powers, female    DOB: 07-15-1955,  MRN: [id number]  Chief Complaint  Patient presents with  . Foot Pain    F/U Lt hematoma and foot injury Pt.s tates," it's beeter but no well, it's still swollen and sore at 2nd toe when I move it; 6/10 aching pain." -w/ swellgin -wrose with movement Tx: heat and IBU     64 y.o. female presents with the above complaint. History confirmed with patient.   Objective:  Physical Exam: warm, good capillary refill, no trophic changes or ulcerative lesions, normal DP and PT pulses and normal sensory exam. Left Foot: soft tissue swelling noted over the 2nd metatarsal head and tenderness of the 2nd metatarsal head. Hematoma appears resolved.  No images are attached to the encounter.  Radiographs: X-ray of the left foot: prior XR reviewed   Assessment:   1. Capsulitis of foot, left   2. Hematoma of left foot   3. Raynaud's disease without gangrene      Plan:  Patient was evaluated and treated and all questions answered.  Hematoma Left Foot -Resolved.   Reynaud's Disease -Discussed etiology. Stable  Capsulitis -Educated on etiology -XR reviewed with patient -Injection delivered to the painful joint  Procedure: Joint Injection Location: Left 2nd MPJ joint Skin Prep: Alcohol. Injectate: 0.5 cc 1% lidocaine plain, 0.5 cc dexamethasone phosphate. Disposition: Patient tolerated procedure well. Injection site dressed with a band-aid.  Return in about 3 weeks (around 06/28/2019) for Capsulitis, Left.

## 2019-07-04 ENCOUNTER — Ambulatory Visit (INDEPENDENT_AMBULATORY_CARE_PROVIDER_SITE_OTHER): Payer: BC Managed Care – PPO | Admitting: Podiatry

## 2019-07-04 ENCOUNTER — Other Ambulatory Visit: Payer: Self-pay

## 2019-07-04 DIAGNOSIS — M778 Other enthesopathies, not elsewhere classified: Secondary | ICD-10-CM | POA: Diagnosis not present

## 2019-07-04 DIAGNOSIS — I7300 Raynaud's syndrome without gangrene: Secondary | ICD-10-CM | POA: Diagnosis not present

## 2019-07-04 DIAGNOSIS — S9032XA Contusion of left foot, initial encounter: Secondary | ICD-10-CM | POA: Diagnosis not present

## 2019-07-04 NOTE — Progress Notes (Signed)
  Subjective:  Patient ID: Kayla Powers, female    DOB: 1955/05/27,  MRN: [id number]  Chief Complaint  Patient presents with  . capsulitis    F/U Lt capsulitis and hematoma Pt. states," pain is the smae, swelling is the same, pain only wiht movements; 7/10." Tx: epsom salt soaking     64 y.o. female presents with the above complaint. History confirmed with patient.   Objective:  Physical Exam: warm, good capillary refill, no trophic changes or ulcerative lesions, normal DP and PT pulses and normal sensory exam. Left Foot: soft tissue swelling noted over the 2nd metatarsal head and tenderness of the 2nd metatarsal head. Hematoma appears mostly resolved.   Assessment:   1. Capsulitis of foot, left   2. Hematoma of left foot   3. Raynaud's disease without gangrene      Plan:  Patient was evaluated and treated and all questions answered.  Hematoma Left Foot -May still have some residual hematoma. Discussed ST massage.Marland Kitchen   Reynaud's Disease -Discussed etiology. Stable  Capsulitis -Continue short period of immobilization with surgical shoe. F/u should pain persist.  No follow-ups on file.

## 2021-05-19 IMAGING — CR DG FOOT COMPLETE 3+V*L*
3 series · 3 of 3 positions shown · non-contrast
Comparison: None.

CLINICAL DATA: Pain dropped jar of peanut Erick W

EXAM:
LEFT FOOT - COMPLETE 3+ VIEW

[x foot ap left]
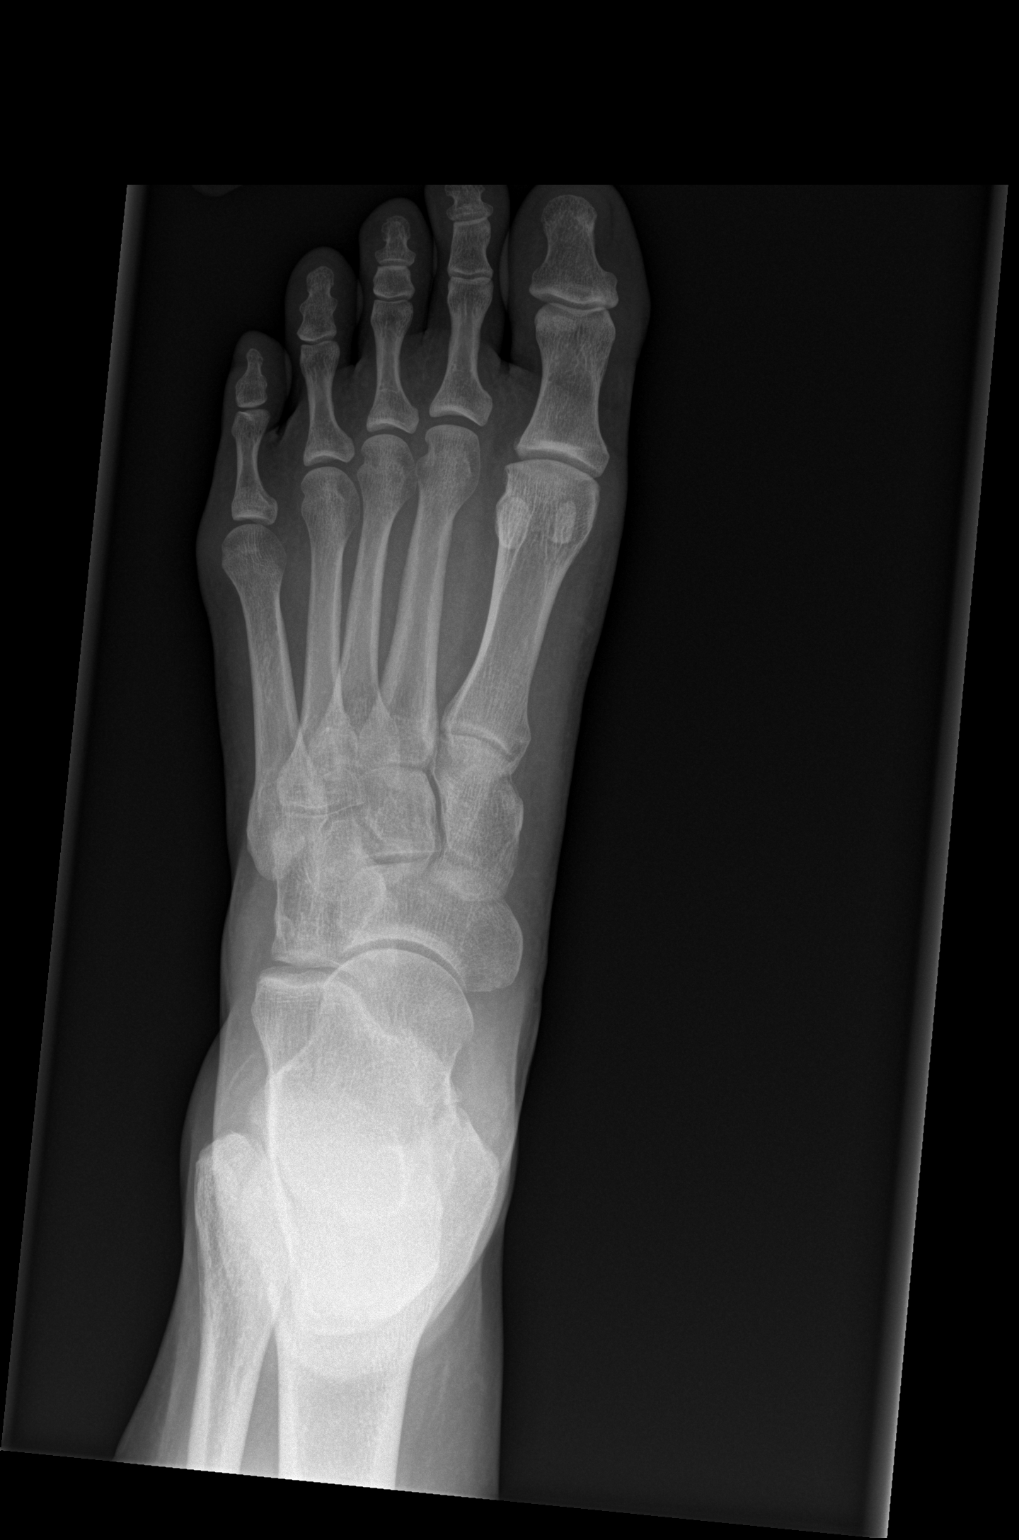

[x foot obl left]
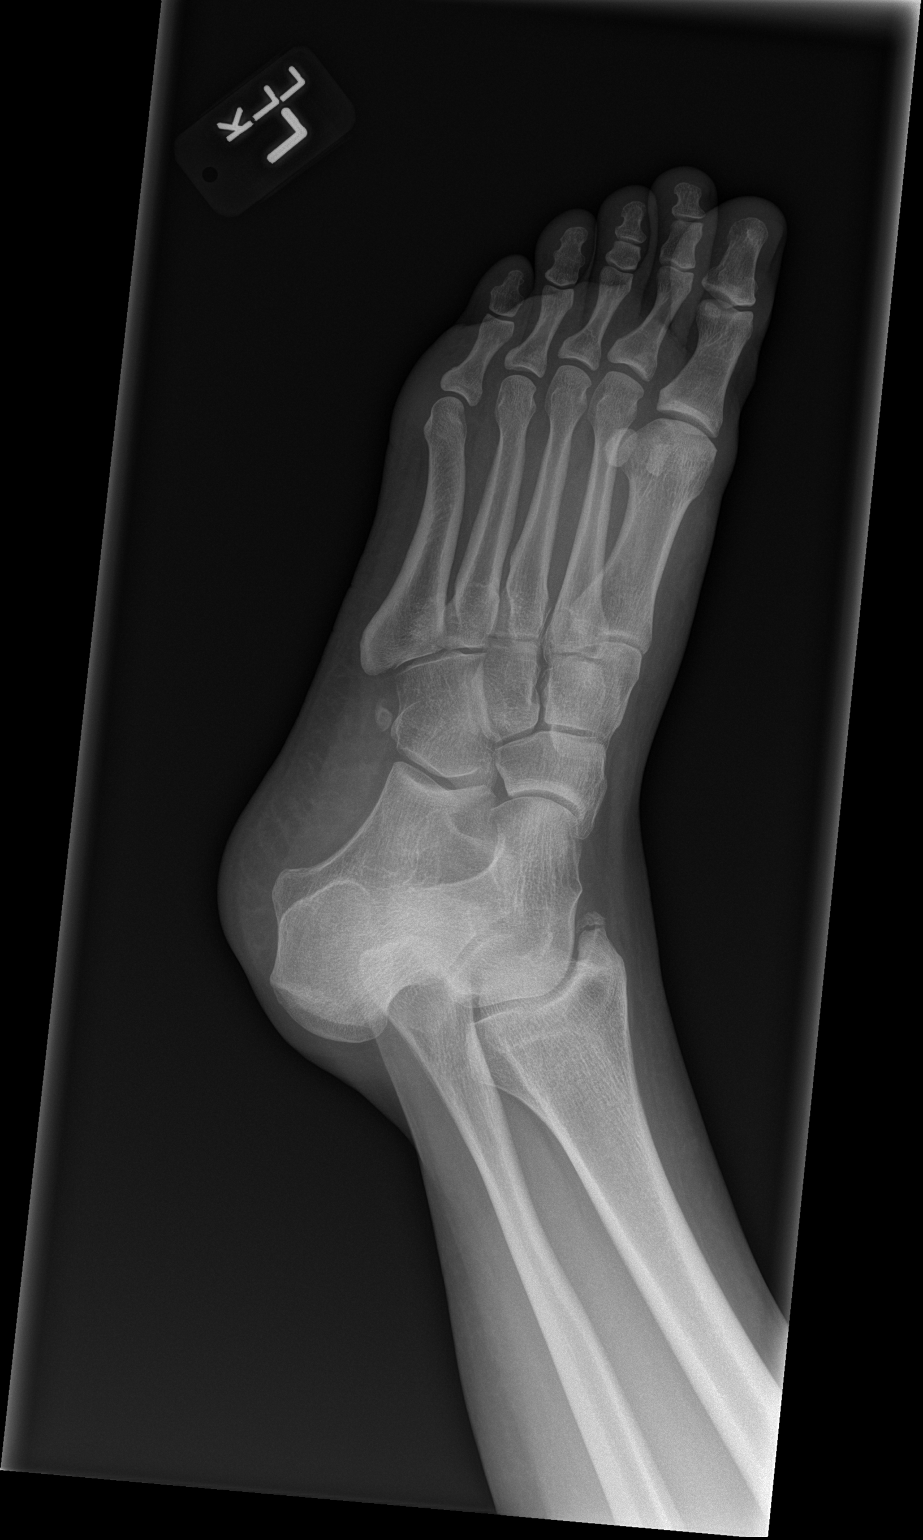

[x foot lat left]
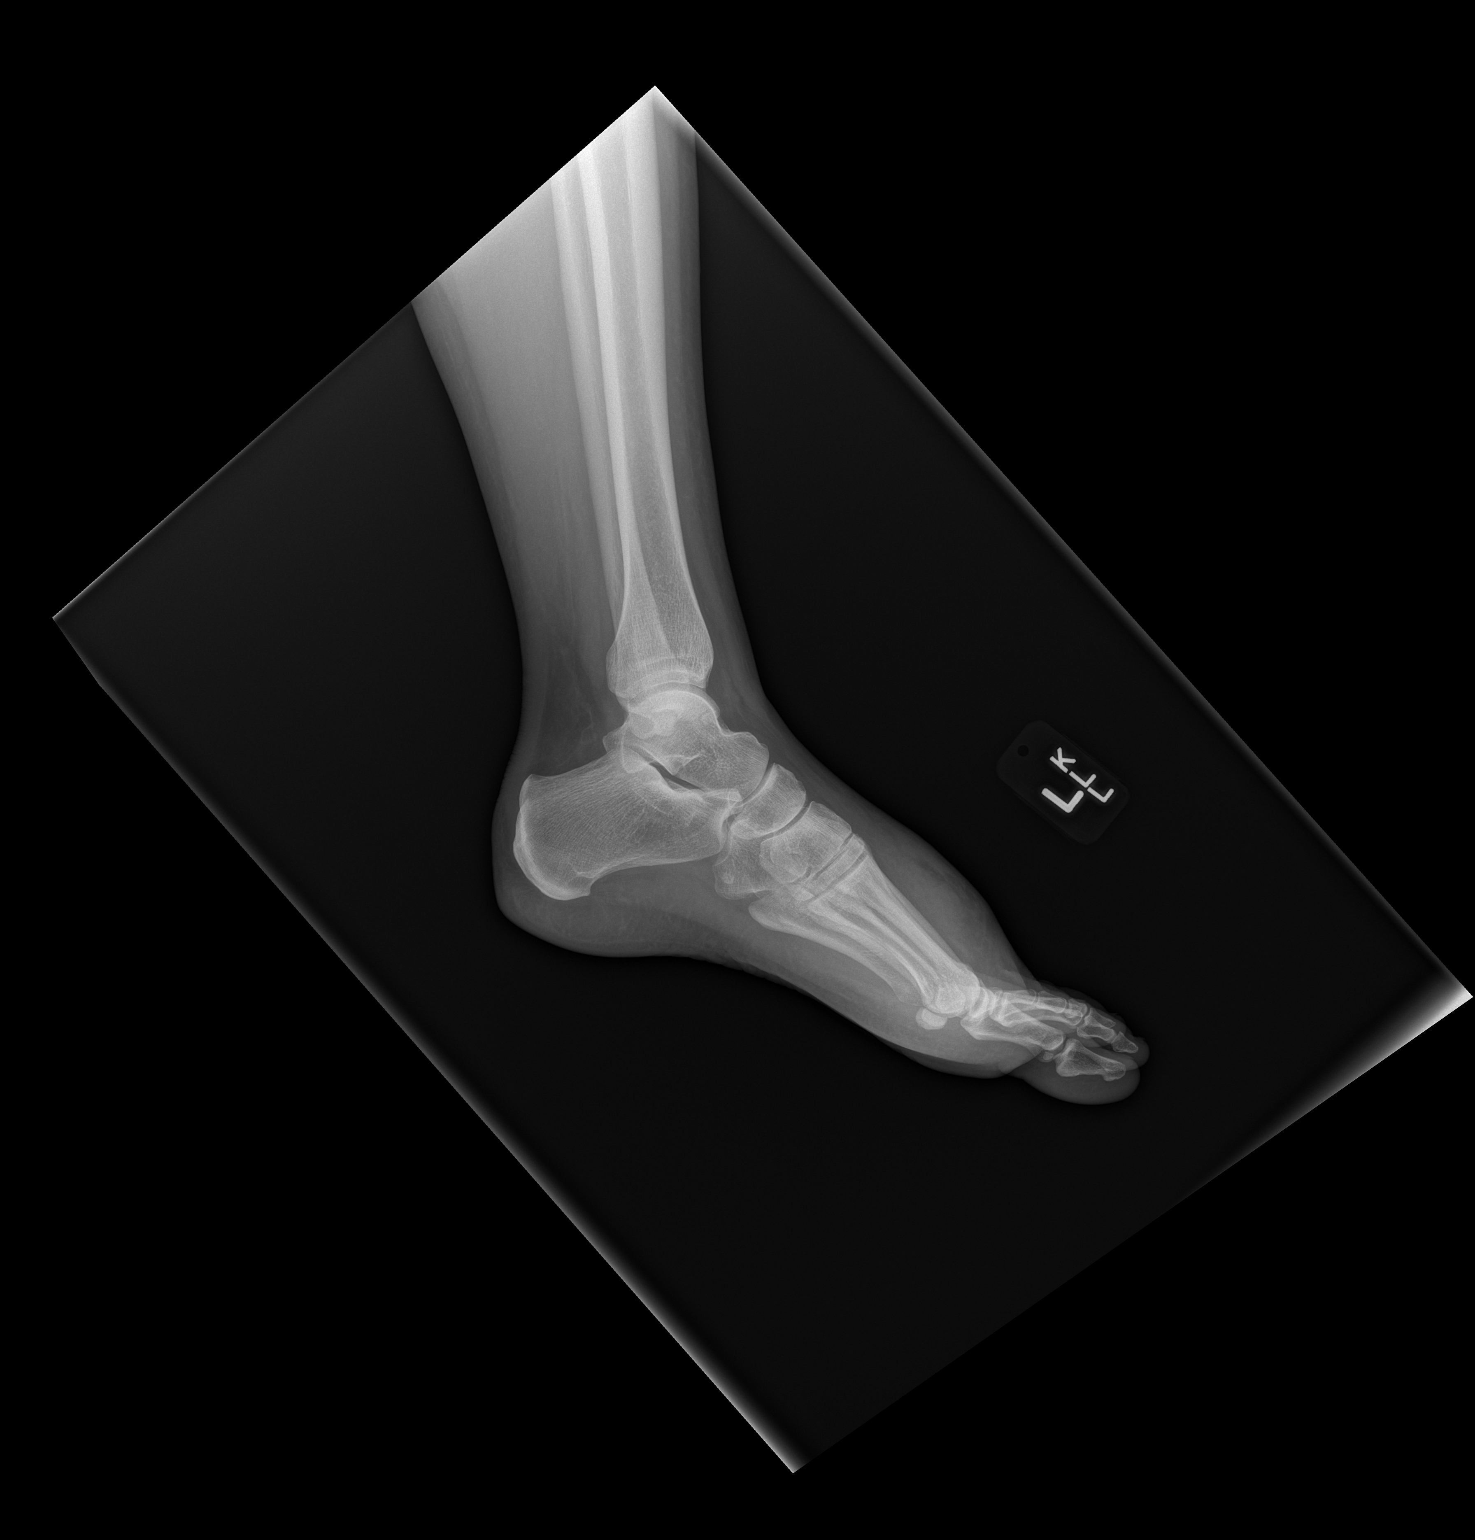

[3 of 3 positions shown; findings below may reference images not displayed]

FINDINGS: No acute fracture or dislocation. There is a nonunited prior injury
of the medial malleolus. Diffuse dorsal soft tissue swelling is
seen. Normal bone mineralization seen throughout.
IMPRESSION: No acute osseous abnormality.

## 2022-06-02 ENCOUNTER — Ambulatory Visit (INDEPENDENT_AMBULATORY_CARE_PROVIDER_SITE_OTHER): Payer: BC Managed Care – PPO | Admitting: Podiatry

## 2022-06-02 DIAGNOSIS — B351 Tinea unguium: Secondary | ICD-10-CM | POA: Diagnosis not present

## 2022-06-02 MED ORDER — FLUCONAZOLE 150 MG PO TABS: 150.0000 mg | ORAL_TABLET | ORAL | 0 refills | Status: DC

## 2022-06-02 NOTE — Progress Notes (Signed)
  Subjective:  Patient ID: Kayla Powers, female    DOB: 1955-04-29,   MRN: [id number]  Chief Complaint  Patient presents with   Nail Problem    bil toenail fungus( req Dr Blenda Mounts)    67 y.o. female presents for concern of toenail fungus on both of her feet that has been present for about 8 months. She has had fungus in the past that was treated by Dr. March Rummage with fluconazole and cleared it up. Hoping to try this again . Denies any other pedal complaints. Denies n/v/f/c.   Past Medical History:  Diagnosis Date   ADHD (attention deficit hyperactivity disorder)    Arrhythmia    tachycardia   Arthritis    Depression    Dyspnea    GERD (gastroesophageal reflux disease)    Tachycardia    Wears glasses    Wears hearing aid    left    Objective:  Physical Exam: Vascular: DP/PT pulses 2/4 bilateral. CFT <3 seconds. Normal hair growth on digits. No edema.  Skin. No lacerations or abrasions bilateral feet. Right hallux nail with some distal discoloration and subungual debris.  Musculoskeletal: MMT 5/5 bilateral lower extremities in DF, PF, Inversion and Eversion. Deceased ROM in DF of ankle joint.  Neurological: Sensation intact to light touch.   Assessment:   1. Onychomycosis      Plan:  Patient was evaluated and treated and all questions answered. -Examined patient -Discussed treatment options for painful dystrophic nails  -Discussed fungal nail treatment options including oral, topical, and laser treatments.  -Will try a round of fluconazole again. 4 pills taken once weekly prescribed.  -Patient to return in 3 months for recheck.    Lorenda Peck, DPM

## 2022-09-01 ENCOUNTER — Ambulatory Visit (INDEPENDENT_AMBULATORY_CARE_PROVIDER_SITE_OTHER): Payer: BC Managed Care – PPO | Admitting: Podiatry

## 2022-09-01 ENCOUNTER — Encounter: Payer: Self-pay | Admitting: Podiatry

## 2022-09-01 DIAGNOSIS — B351 Tinea unguium: Secondary | ICD-10-CM

## 2022-09-01 MED ORDER — FLUCONAZOLE 150 MG PO TABS: 150.0000 mg | ORAL_TABLET | ORAL | 0 refills | Status: AC

## 2022-09-01 NOTE — Progress Notes (Signed)
  Subjective:  Patient ID: Kayla Powers, female    DOB: 1955/08/17,   MRN: [id number]  Chief Complaint  Patient presents with   Nail Problem    Follow up nail fungus - completed 1 month of fluconazole   "Not much change"    67 y.o. female presents for follow-up of toenail fungus. Has been on the fluconazole and has not noticed much change.  She has had fungus in the past that was treated by Dr. Samuella Cota with fluconazole and cleared it up. Hoping to try this again . Denies any other pedal complaints. Denies n/v/f/c.   Past Medical History:  Diagnosis Date   ADHD (attention deficit hyperactivity disorder)    Arrhythmia    tachycardia   Arthritis    Depression    Dyspnea    GERD (gastroesophageal reflux disease)    Tachycardia    Wears glasses    Wears hearing aid    left    Objective:  Physical Exam: Vascular: DP/PT pulses 2/4 bilateral. CFT <3 seconds. Normal hair growth on digits. No edema.  Skin. No lacerations or abrasions bilateral feet. Right hallux nail with some distal discoloration and subungual debris. Very minimal.  Musculoskeletal: MMT 5/5 bilateral lower extremities in DF, PF, Inversion and Eversion. Deceased ROM in DF of ankle joint.  Neurological: Sensation intact to light touch.   Assessment:   1. Onychomycosis       Plan:  Patient was evaluated and treated and all questions answered. -Examined patient -Discussed treatment options for painful dystrophic nails  -Clinical picture and Fungal culture was obtained by removing a portion of the hard nail itself from each of the involved toenails using a sterile nail nipper and sent to [facility] lab. Patient tolerated the biopsy procedure well without discomfort or need for anesthesia.  -Discussed fungal nail treatment options including oral, topical, and laser treatments.  -Will try another round of fluconazole in meantime. Refilled.  -Patient to return in 4 weeks for follow up evaluation and discussion of fungal  culture results or sooner if symptoms worsen.    Louann Sjogren, DPM

## 2022-09-30 ENCOUNTER — Ambulatory Visit (INDEPENDENT_AMBULATORY_CARE_PROVIDER_SITE_OTHER): Payer: BC Managed Care – PPO | Admitting: Podiatry

## 2022-09-30 ENCOUNTER — Encounter: Payer: Self-pay | Admitting: Podiatry

## 2022-09-30 DIAGNOSIS — B351 Tinea unguium: Secondary | ICD-10-CM

## 2022-09-30 NOTE — Progress Notes (Signed)
  Subjective:  Patient ID: Kayla Powers, female    DOB: 1956/03/03,   MRN: [id number]  No chief complaint on file.   67 y.o. female presents for follow-up of toenail fungus and to review culture results as well as progress with fluconazole.  Denies any other pedal complaints. Denies n/v/f/c.   Past Medical History:  Diagnosis Date   ADHD (attention deficit hyperactivity disorder)    Arrhythmia    tachycardia   Arthritis    Depression    Dyspnea    GERD (gastroesophageal reflux disease)    Tachycardia    Wears glasses    Wears hearing aid    left    Objective:  Physical Exam: Vascular: DP/PT pulses 2/4 bilateral. CFT <3 seconds. Normal hair growth on digits. No edema.  Skin. No lacerations or abrasions bilateral feet. Right hallux nail with some distal discoloration and subungual debris. Very minimal.  Musculoskeletal: MMT 5/5 bilateral lower extremities in DF, PF, Inversion and Eversion. Deceased ROM in DF of ankle joint.  Neurological: Sensation intact to light touch.   Assessment:   No diagnosis found.     Plan:  Patient was evaluated and treated and all questions answered. -Examined patient -Discussed treatment options for painful dystrophic nails  -Culture positive for fungus as well as microtrauma.  -Discussed fungal nail treatment options including oral, topical, and laser treatments.  -Appears to have improvement mildy with fluconazole.  Will continue to montior.  Follow up as needed.     Louann Sjogren, DPM

## 2023-10-29 DIAGNOSIS — I10 Essential (primary) hypertension: Secondary | ICD-10-CM | POA: Diagnosis not present

## 2023-10-29 DIAGNOSIS — M8580 Other specified disorders of bone density and structure, unspecified site: Secondary | ICD-10-CM | POA: Diagnosis not present

## 2023-10-29 DIAGNOSIS — F5101 Primary insomnia: Secondary | ICD-10-CM | POA: Diagnosis not present

## 2023-10-29 DIAGNOSIS — Z780 Asymptomatic menopausal state: Secondary | ICD-10-CM | POA: Diagnosis not present

## 2023-10-29 DIAGNOSIS — E782 Mixed hyperlipidemia: Secondary | ICD-10-CM | POA: Diagnosis not present

## 2023-10-29 DIAGNOSIS — Z0000 Encounter for general adult medical examination without abnormal findings: Secondary | ICD-10-CM | POA: Diagnosis not present

## 2023-10-29 DIAGNOSIS — F988 Other specified behavioral and emotional disorders with onset usually occurring in childhood and adolescence: Secondary | ICD-10-CM | POA: Diagnosis not present

## 2023-10-29 DIAGNOSIS — E038 Other specified hypothyroidism: Secondary | ICD-10-CM | POA: Diagnosis not present

## 2023-12-28 ENCOUNTER — Other Ambulatory Visit: Payer: Self-pay | Admitting: Medical Genetics

## 2023-12-31 ENCOUNTER — Other Ambulatory Visit
Admission: RE | Admit: 2023-12-31 | Discharge: 2023-12-31 | Disposition: A | Discharge status: Home / Self Care | Payer: Self-pay | Source: Ambulatory Visit | Attending: Medical Genetics | Admitting: Medical Genetics

## 2024-01-08 LAB — GENECONNECT MOLECULAR SCREEN: Genetic Analysis Overall Interpretation: NEGATIVE

## 2024-01-28 DIAGNOSIS — F988 Other specified behavioral and emotional disorders with onset usually occurring in childhood and adolescence: Secondary | ICD-10-CM | POA: Diagnosis not present

## 2024-02-11 DIAGNOSIS — M8580 Other specified disorders of bone density and structure, unspecified site: Secondary | ICD-10-CM | POA: Diagnosis not present

## 2024-02-11 DIAGNOSIS — Z6829 Body mass index (BMI) 29.0-29.9, adult: Secondary | ICD-10-CM | POA: Diagnosis not present

## 2024-02-11 DIAGNOSIS — Z01419 Encounter for gynecological examination (general) (routine) without abnormal findings: Secondary | ICD-10-CM | POA: Diagnosis not present

## 2024-02-11 DIAGNOSIS — Z1231 Encounter for screening mammogram for malignant neoplasm of breast: Secondary | ICD-10-CM | POA: Diagnosis not present

## 2024-02-11 DIAGNOSIS — N952 Postmenopausal atrophic vaginitis: Secondary | ICD-10-CM | POA: Diagnosis not present
